# Patient Record
Sex: Male | Born: 1940 | Race: White | Hispanic: No | Marital: Married | Smoking: Never smoker
Health system: Southern US, Community
[De-identification: ages and names within clinical notes are randomized; demographics above are authoritative.]

## PROBLEM LIST (undated history)

## (undated) DIAGNOSIS — R7989 Other specified abnormal findings of blood chemistry: Secondary | ICD-10-CM

## (undated) DIAGNOSIS — Z87448 Personal history of other diseases of urinary system: Secondary | ICD-10-CM

## (undated) DIAGNOSIS — Z85828 Personal history of other malignant neoplasm of skin: Secondary | ICD-10-CM

## (undated) DIAGNOSIS — N400 Enlarged prostate without lower urinary tract symptoms: Secondary | ICD-10-CM

## (undated) DIAGNOSIS — E785 Hyperlipidemia, unspecified: Secondary | ICD-10-CM

## (undated) DIAGNOSIS — I10 Essential (primary) hypertension: Secondary | ICD-10-CM

## (undated) DIAGNOSIS — Z87442 Personal history of urinary calculi: Secondary | ICD-10-CM

## (undated) DIAGNOSIS — C801 Malignant (primary) neoplasm, unspecified: Secondary | ICD-10-CM

## (undated) DIAGNOSIS — E119 Type 2 diabetes mellitus without complications: Secondary | ICD-10-CM

## (undated) DIAGNOSIS — Z87440 Personal history of urinary (tract) infections: Secondary | ICD-10-CM

## (undated) HISTORY — PX: TONSILLECTOMY: SUR1361

## (undated) HISTORY — PX: HAND SURGERY: SHX662

---

## 2000-07-04 ENCOUNTER — Ambulatory Visit (HOSPITAL_BASED_OUTPATIENT_CLINIC_OR_DEPARTMENT_OTHER): Admission: RE | Admit: 2000-07-04 | Discharge: 2000-07-04 | Payer: Self-pay | Admitting: Orthopedic Surgery

## 2009-04-02 ENCOUNTER — Encounter: Admission: RE | Admit: 2009-04-02 | Discharge: 2009-04-02 | Payer: Self-pay | Admitting: Internal Medicine

## 2010-08-12 IMAGING — CR DG THORACIC SPINE 3V
4 series · 4 of 4 positions shown · non-contrast
Comparison: None

CLINICAL DATA: Possible T9 fracture

THORACIC SPINE - 2 VIEW + SWIMMERS

[t t-spine a.p.]
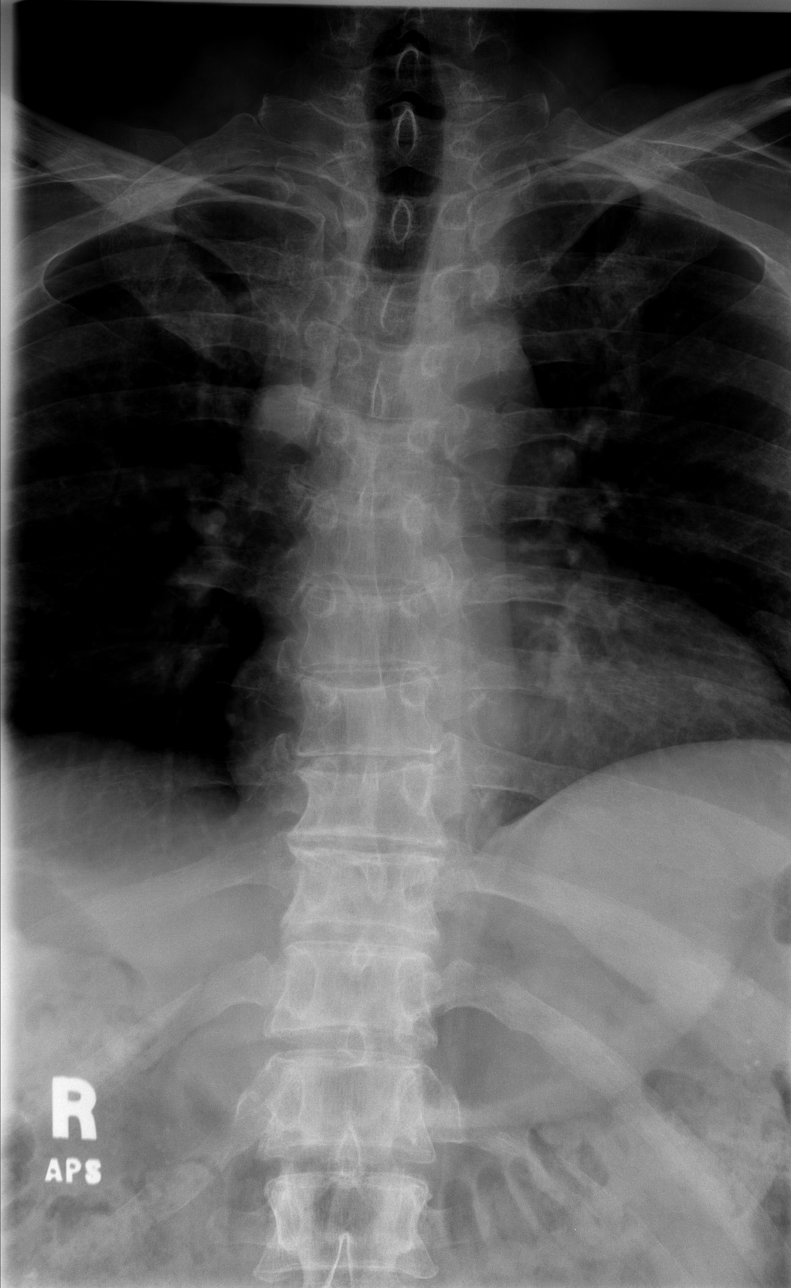

[t t-spine lat * (1 of 2)]
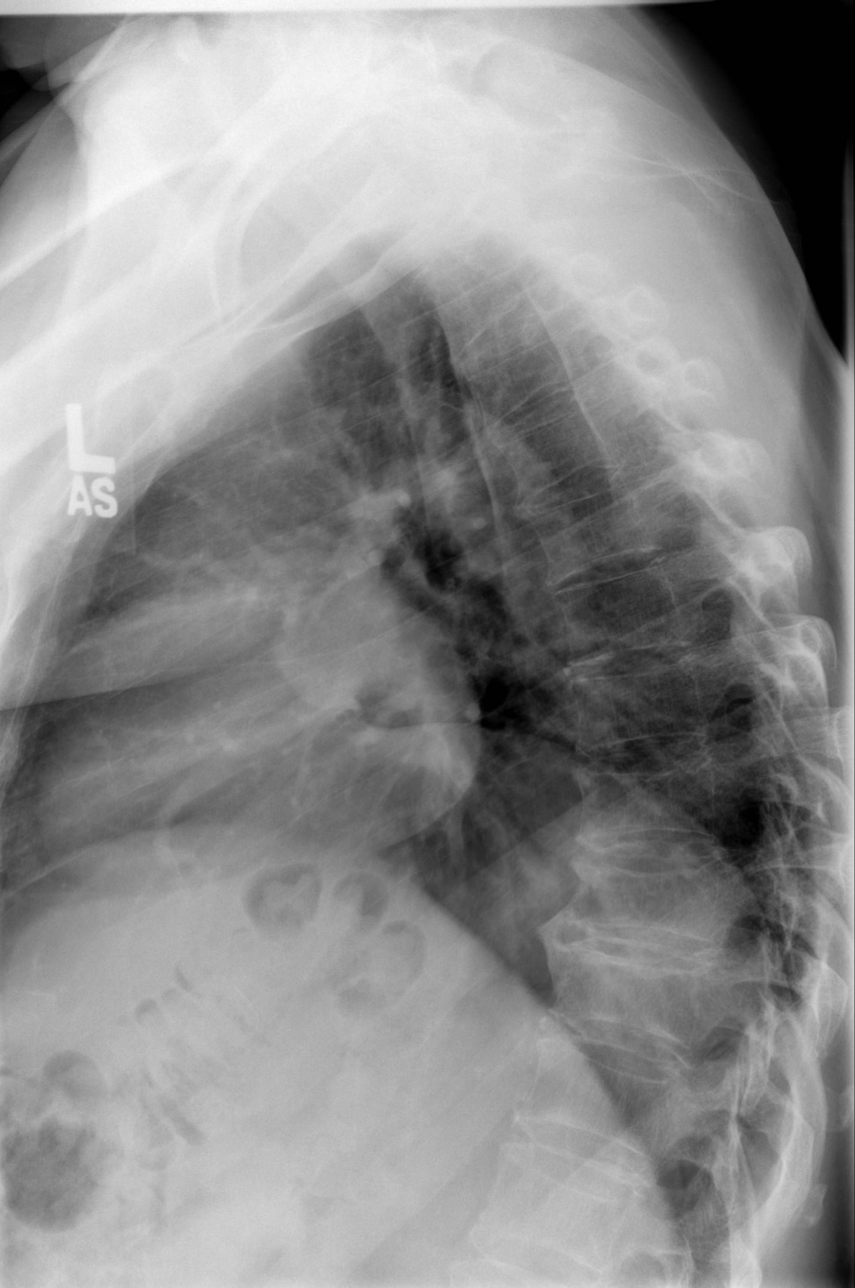

[t t-spine lat * (2 of 2)]
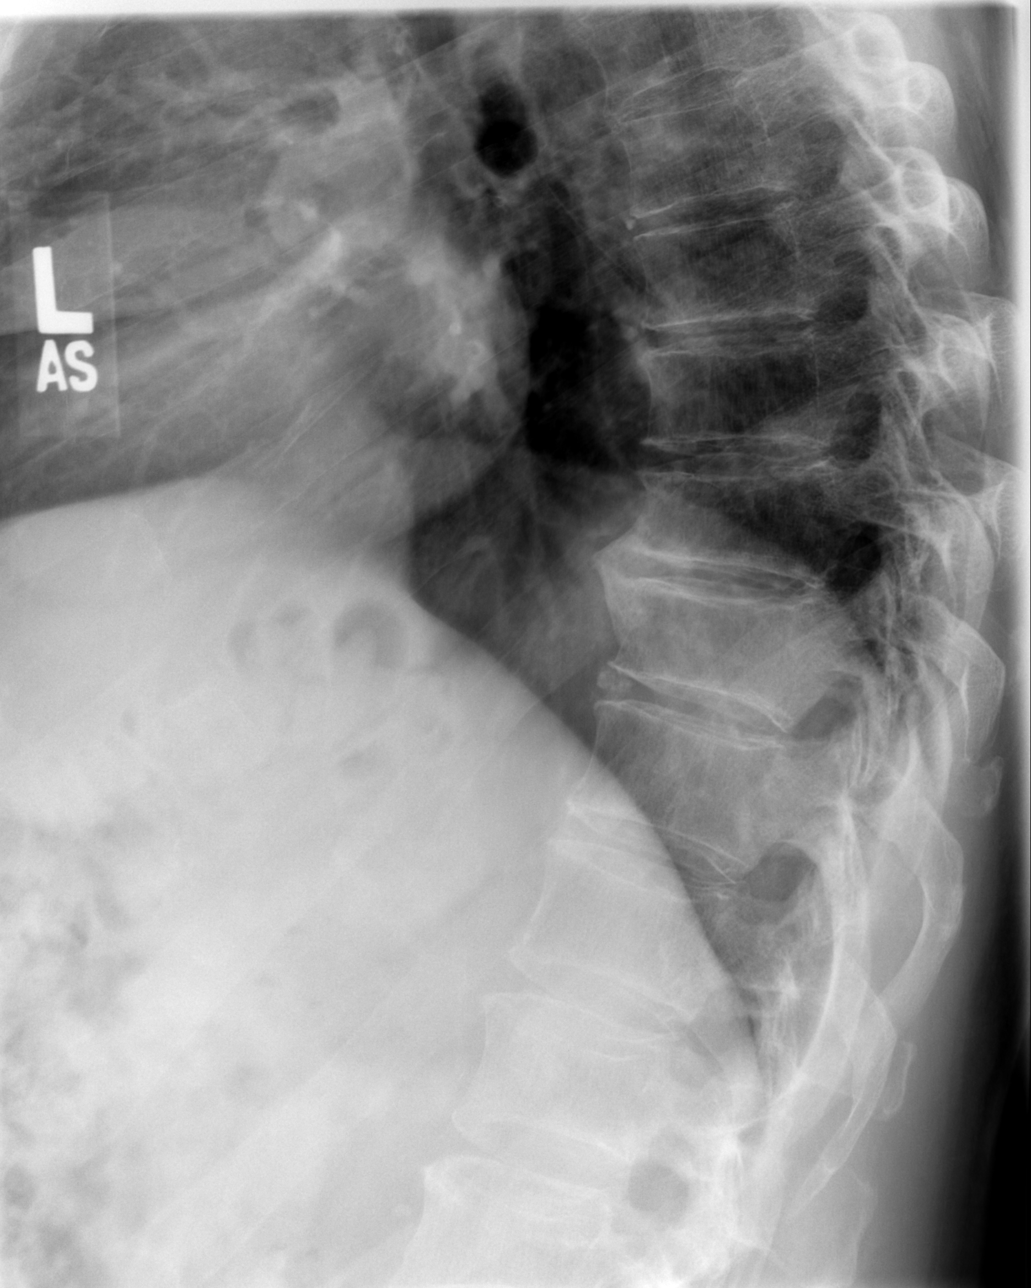

[t swimmers]
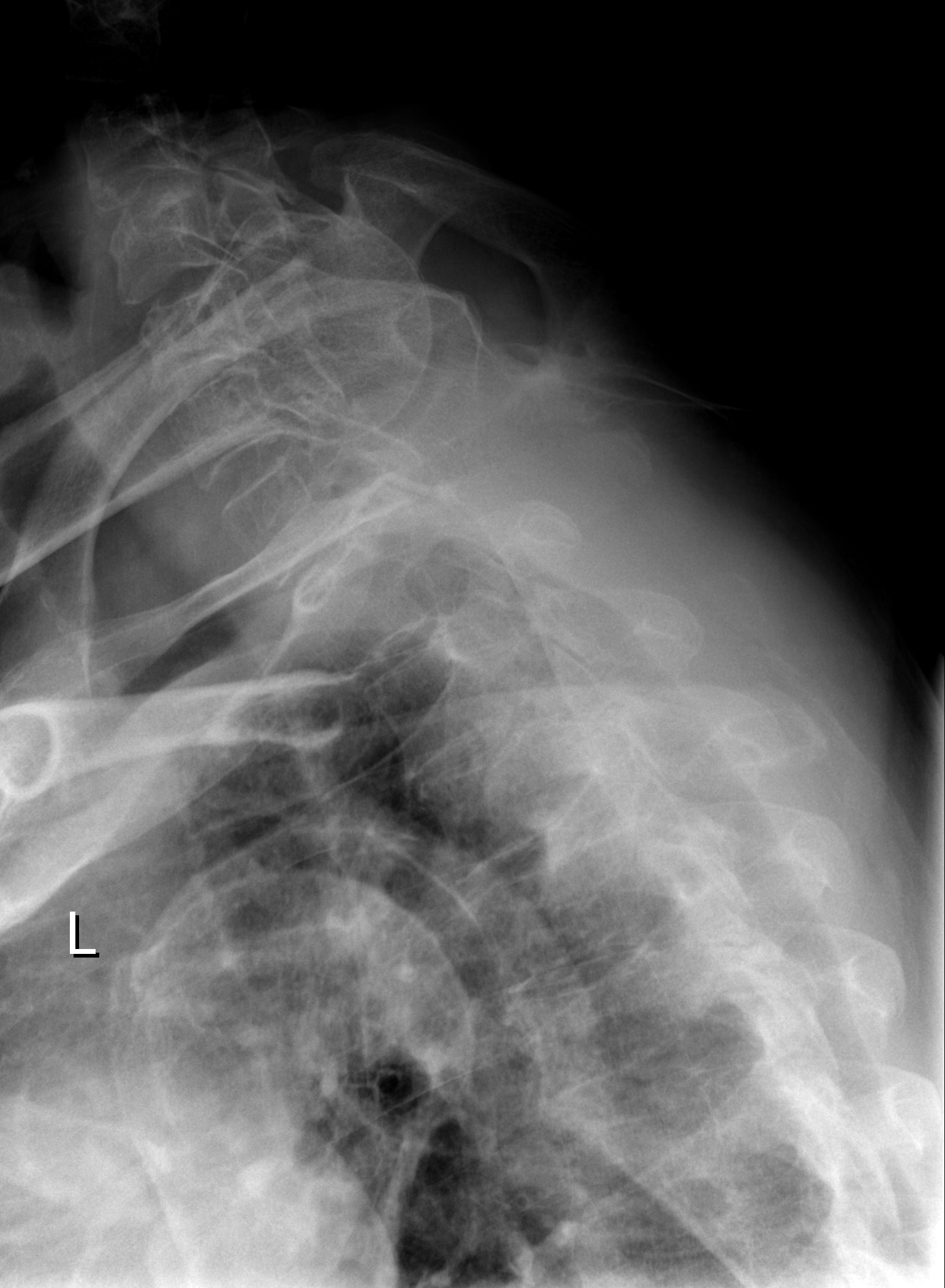

[4 of 4 positions shown; findings below may reference images not displayed]

FINDINGS: Fracture of the right aspect of T9 body with mild
depression.  No retropulsion.  Spondylotic changes are noted in the
lower thoracic spine.  No evidence of bony destruction/lysis.
IMPRESSION: T9 compression fracture.

## 2010-10-02 HISTORY — PX: CYSTOSCOPY: SUR368

## 2010-10-02 HISTORY — PX: OTHER SURGICAL HISTORY: SHX169

## 2010-11-10 ENCOUNTER — Encounter (HOSPITAL_COMMUNITY)
Admission: RE | Admit: 2010-11-10 | Discharge: 2010-11-10 | Disposition: A | Discharge status: Home / Self Care | Payer: Medicare Other | Source: Ambulatory Visit | Attending: General Surgery | Admitting: General Surgery

## 2010-11-10 ENCOUNTER — Ambulatory Visit (HOSPITAL_COMMUNITY)
Admission: RE | Admit: 2010-11-10 | Discharge: 2010-11-10 | Disposition: A | Discharge status: Home / Self Care | Payer: Medicare Other | Source: Ambulatory Visit | Attending: General Surgery | Admitting: General Surgery

## 2010-11-10 ENCOUNTER — Other Ambulatory Visit (HOSPITAL_COMMUNITY): Payer: Self-pay | Admitting: General Surgery

## 2010-11-10 DIAGNOSIS — M439 Deforming dorsopathy, unspecified: Secondary | ICD-10-CM | POA: Insufficient documentation

## 2010-11-10 DIAGNOSIS — R222 Localized swelling, mass and lump, trunk: Secondary | ICD-10-CM | POA: Insufficient documentation

## 2010-11-10 DIAGNOSIS — Z01818 Encounter for other preprocedural examination: Secondary | ICD-10-CM | POA: Insufficient documentation

## 2010-11-14 ENCOUNTER — Ambulatory Visit (HOSPITAL_COMMUNITY)
Admission: RE | Admit: 2010-11-14 | Discharge: 2010-11-14 | Disposition: A | Discharge status: Home / Self Care | Payer: Medicare Other | Source: Ambulatory Visit | Attending: General Surgery | Admitting: General Surgery

## 2010-11-14 ENCOUNTER — Other Ambulatory Visit: Payer: Self-pay | Admitting: General Surgery

## 2010-11-14 DIAGNOSIS — D235 Other benign neoplasm of skin of trunk: Secondary | ICD-10-CM | POA: Insufficient documentation

## 2010-11-14 DIAGNOSIS — Z01812 Encounter for preprocedural laboratory examination: Secondary | ICD-10-CM | POA: Insufficient documentation

## 2010-11-14 DIAGNOSIS — Z01810 Encounter for preprocedural cardiovascular examination: Secondary | ICD-10-CM | POA: Insufficient documentation

## 2010-11-14 LAB — BASIC METABOLIC PANEL
CO2: 23 mEq/L (ref 19–32)
Calcium: 9.8 mg/dL (ref 8.4–10.5)
Creatinine, Ser: 1.66 mg/dL — ABNORMAL HIGH (ref 0.4–1.5)
GFR calc Af Amer: 50 mL/min — ABNORMAL LOW (ref >60)
Sodium: 139 mEq/L (ref 135–145)

## 2010-11-16 LAB — GLUCOSE, CAPILLARY: Glucose-Capillary: 148 mg/dL — ABNORMAL HIGH (ref 70–99)

## 2010-11-20 NOTE — Op Note (Signed)
  NAME:  Kevin Clements, Kevin Clements            ACCOUNT NO.:  [id number]  MEDICAL RECORD NO.:  [id number]           PATIENT TYPE:  O  LOCATION:  SDSC                         FACILITY:  [facility]  PHYSICIAN:  Almond Lint, MD       DATE OF BIRTH:  Mar 17, 1941  DATE OF PROCEDURE:  11/14/2010 DATE OF DISCHARGE:  11/14/2010                              OPERATIVE REPORT   PREOPERATIVE DIAGNOSIS:  Left back mass.  POSTOPERATIVE DIAGNOSIS:  Left back mass.  PROCEDURE:  Excision of left back mass.  SURGEON:  Almond Lint, MD  ANESTHESIA:  General and local.  FINDINGS:  Probable sebaceous cyst.  SPECIMEN:  Left back mass to Pathology.  ESTIMATED BLOOD LOSS:  Minimal.  COMPLICATIONS:  None known.  PROCEDURE:  [name] was identified in the holding area and taken to the operating room where he was placed supine on the operating table. General anesthesia was induced under LMA.  He was then placed into the right lateral decubitus position.  The prior incision was identified and anesthetized.  The mass was mobile.  An ellipse of skin was taken around the old incision and the skin was elevated with two Allis clamps.  A #15 blade was used to dissect around the mass.  The mass was noted to be tethered to the skin approximately a centimeter and a half cranial from the prior incision.  This was shaved off with the 15 blade.  The mass was taken all the way around to the fascia on both sides.  The cavity was copiously irrigated.  The wound was inspected for hemostasis with cautery, and all the subcutaneous flaps and the directions were examined carefully.  The wound was then reapproximated with 3-0 Vicryl deep dermal suture and 4-0 Monocryl running subcuticular suture.  The wound was then dressed with benzoin, Steri-Strips, gauze, and Tegaderm.  The patient was placed back in the supine position and was extubated and taken to the PACU in stable condition.  Needle, sponge, and instrument counts were  correct.    Almond Lint, MD    FB/MEDQ  D:  11/14/2010  T:  11/15/2010  Job:  [id number]  Electronically Signed by Almond Lint MD on 11/20/2010 12:30:55 AM

## 2010-11-24 LAB — CBC
HCT: 41.3 % (ref 39.0–52.0)
MCHC: 34.1 g/dL (ref 30.0–36.0)
Platelets: 129 10*3/uL — ABNORMAL LOW (ref 150–400)
RDW: 13.2 % (ref 11.5–15.5)
WBC: 4.8 10*3/uL (ref 4.0–10.5)

## 2010-11-24 LAB — BASIC METABOLIC PANEL
BUN: 41 mg/dL — ABNORMAL HIGH (ref 6–23)
Calcium: 9.8 mg/dL (ref 8.4–10.5)
Creatinine, Ser: 1.8 mg/dL — ABNORMAL HIGH (ref 0.4–1.5)
GFR calc non Af Amer: 38 mL/min — ABNORMAL LOW (ref >60)
Glucose, Bld: 153 mg/dL — ABNORMAL HIGH (ref 70–99)
Potassium: 4.3 mEq/L (ref 3.5–5.1)

## 2010-11-24 LAB — SURGICAL PCR SCREEN
MRSA, PCR: NEGATIVE
Staphylococcus aureus: POSITIVE — AB

## 2011-02-17 NOTE — Op Note (Signed)
[facility]  Patient:    Kevin Clements, Kevin Clements                     MRN: [id number] Proc. Date: 07/04/00 Adm. 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  PROCEDURE:  Excision of Dupuytrens contracture, palmar aspect right hand, with release of metatarsophalangeal and proximal interphalangeal joints, right finger on the right.  SURGEON:  Artist Pais. Mina Marble, [name]  ASSISTANT:  Junius Roads. Ireton, P.A.C.  ANESTHESIA:  Axillary block.  TOURNIQUET TIME:  1 hour 28 minutes.  COMPLICATIONS:  None.  DRAINS:  None.  DESCRIPTION OF PROCEDURE:  The patient was taken to the operating room and after the induction of adequate axillary block analgesia, the right upper extremity was prepped and draped in the usual sterile fashion.  Esmarch was used to exsanguinate the limb, and the tourniquet was inflated to 250 mmHg. At this point in time, Brunner-type incisions were made in line with the metacarpal on the ring finger extending out to the level of the PIP joint as well as over the metacarpal of the long finger, and flaps were raised accordingly.  After flaps were raised the ring finger Dupuytrens cord was identified proximally at the intersection of the palmar fascia with the diseased cord, and it was transected.  It was then carefully dissected free to the level of the A1 pulley.  At this point in time, the neurovascular bundles were both identified radially and ulnarly, and dissection was carried out. The ulnar neurovascular bundle was seen to be crossing the midline at the area of the  metacarpophalangeal joint, then diving deep and re-emerging along the ulnar border.  The Dupuytrens cord was intimately involved with the ulnar neurovascular bundle, which was carefully dissected free with tedious careful dissection using the tenotomy scissors and a 15 blade.  Eventually the entire Dupuytrens cord was excised in its entirety and sent for pathologic confirmation.  At this point in time, the incision over the long finger was dissected down through the intersection between diseased and normal fascia. This was carefully excised with identification of the neurovascular bundles radially and ulnarly.  At this point in time, the would was thoroughly irrigated, hemostasis was achieved with bipolar cautery, and the wounds were then closed with 5-0 nylon in a combination of simple and horizontal mattress sutures.  Prior to wound closure, a #5 pediatric feeding tube was placed into the wounds and a mixture of triamcinolone acetate and 0.25% plain Marcaine was injected into the wound for postoperative pain control.  The wound was then dressed with Xeroform, 4 x 4s, fluffs, and a splint with the fingers in maximum extension.  The patient tolerated the procedure well and went to recovery in stable fashion. DD:  07/04/00 TD:  07/04/00 Job: [id number]

## 2011-03-22 ENCOUNTER — Inpatient Hospital Stay (HOSPITAL_COMMUNITY): Payer: Medicare Other

## 2011-03-22 ENCOUNTER — Inpatient Hospital Stay (HOSPITAL_COMMUNITY)
Admission: AD | Admit: 2011-03-22 | Discharge: 2011-03-30 | DRG: 670 | Disposition: A | Discharge status: Home / Self Care | Payer: Medicare Other | Source: Ambulatory Visit | Attending: Internal Medicine | Admitting: Internal Medicine

## 2011-03-22 DIAGNOSIS — E1149 Type 2 diabetes mellitus with other diabetic neurological complication: Secondary | ICD-10-CM | POA: Diagnosis present

## 2011-03-22 DIAGNOSIS — N183 Chronic kidney disease, stage 3 unspecified: Secondary | ICD-10-CM | POA: Diagnosis present

## 2011-03-22 DIAGNOSIS — R197 Diarrhea, unspecified: Secondary | ICD-10-CM | POA: Diagnosis present

## 2011-03-22 DIAGNOSIS — K219 Gastro-esophageal reflux disease without esophagitis: Secondary | ICD-10-CM | POA: Diagnosis present

## 2011-03-22 DIAGNOSIS — R339 Retention of urine, unspecified: Secondary | ICD-10-CM | POA: Diagnosis present

## 2011-03-22 DIAGNOSIS — K4090 Unilateral inguinal hernia, without obstruction or gangrene, not specified as recurrent: Secondary | ICD-10-CM | POA: Diagnosis present

## 2011-03-22 DIAGNOSIS — N1330 Unspecified hydronephrosis: Secondary | ICD-10-CM | POA: Diagnosis present

## 2011-03-22 DIAGNOSIS — M899 Disorder of bone, unspecified: Secondary | ICD-10-CM | POA: Diagnosis present

## 2011-03-22 DIAGNOSIS — E785 Hyperlipidemia, unspecified: Secondary | ICD-10-CM | POA: Diagnosis present

## 2011-03-22 DIAGNOSIS — N320 Bladder-neck obstruction: Secondary | ICD-10-CM | POA: Diagnosis present

## 2011-03-22 DIAGNOSIS — E860 Dehydration: Secondary | ICD-10-CM | POA: Diagnosis present

## 2011-03-22 DIAGNOSIS — E1142 Type 2 diabetes mellitus with diabetic polyneuropathy: Secondary | ICD-10-CM | POA: Diagnosis present

## 2011-03-22 DIAGNOSIS — N400 Enlarged prostate without lower urinary tract symptoms: Secondary | ICD-10-CM | POA: Diagnosis present

## 2011-03-22 DIAGNOSIS — H43399 Other vitreous opacities, unspecified eye: Secondary | ICD-10-CM | POA: Diagnosis present

## 2011-03-22 DIAGNOSIS — A088 Other specified intestinal infections: Secondary | ICD-10-CM | POA: Diagnosis present

## 2011-03-22 DIAGNOSIS — Z87891 Personal history of nicotine dependence: Secondary | ICD-10-CM

## 2011-03-22 DIAGNOSIS — I129 Hypertensive chronic kidney disease with stage 1 through stage 4 chronic kidney disease, or unspecified chronic kidney disease: Secondary | ICD-10-CM | POA: Diagnosis present

## 2011-03-22 DIAGNOSIS — E875 Hyperkalemia: Secondary | ICD-10-CM | POA: Diagnosis present

## 2011-03-22 DIAGNOSIS — Z794 Long term (current) use of insulin: Secondary | ICD-10-CM

## 2011-03-22 DIAGNOSIS — E7800 Pure hypercholesterolemia, unspecified: Secondary | ICD-10-CM | POA: Diagnosis present

## 2011-03-22 DIAGNOSIS — M949 Disorder of cartilage, unspecified: Secondary | ICD-10-CM | POA: Diagnosis present

## 2011-03-22 DIAGNOSIS — N170 Acute kidney failure with tubular necrosis: Principal | ICD-10-CM | POA: Diagnosis present

## 2011-03-22 DIAGNOSIS — N210 Calculus in bladder: Secondary | ICD-10-CM | POA: Diagnosis present

## 2011-03-22 DIAGNOSIS — Z7982 Long term (current) use of aspirin: Secondary | ICD-10-CM

## 2011-03-22 LAB — COMPREHENSIVE METABOLIC PANEL
ALT: 16 U/L (ref 0–53)
AST: 12 U/L (ref 0–37)
Alkaline Phosphatase: 40 U/L (ref 39–117)
CO2: 16 mEq/L — ABNORMAL LOW (ref 19–32)
Calcium: 8.6 mg/dL (ref 8.4–10.5)
Chloride: 105 mEq/L (ref 96–112)
GFR calc Af Amer: 11 mL/min — ABNORMAL LOW (ref >60)
GFR calc non Af Amer: 9 mL/min — ABNORMAL LOW (ref >60)
Glucose, Bld: 101 mg/dL — ABNORMAL HIGH (ref 70–99)
Potassium: 6.5 mEq/L (ref 3.5–5.1)
Sodium: 133 mEq/L — ABNORMAL LOW (ref 135–145)

## 2011-03-22 LAB — BASIC METABOLIC PANEL
Chloride: 102 mEq/L (ref 96–112)
GFR calc Af Amer: 11 mL/min — ABNORMAL LOW (ref >60)
GFR calc non Af Amer: 9 mL/min — ABNORMAL LOW (ref >60)
Potassium: 6 mEq/L — ABNORMAL HIGH (ref 3.5–5.1)
Sodium: 130 mEq/L — ABNORMAL LOW (ref 135–145)

## 2011-03-22 LAB — CBC
Hemoglobin: 12.2 g/dL — ABNORMAL LOW (ref 13.0–17.0)
MCH: 32.5 pg (ref 26.0–34.0)
Platelets: 180 10*3/uL (ref 150–400)
RBC: 3.75 MIL/uL — ABNORMAL LOW (ref 4.22–5.81)
WBC: 6.6 10*3/uL (ref 4.0–10.5)

## 2011-03-22 LAB — URINALYSIS, ROUTINE W REFLEX MICROSCOPIC
Glucose, UA: 100 mg/dL — AB
Hgb urine dipstick: NEGATIVE
Ketones, ur: NEGATIVE mg/dL
Leukocytes, UA: NEGATIVE
pH: 5.5 (ref 5.0–8.0)

## 2011-03-22 LAB — CARDIAC PANEL(CRET KIN+CKTOT+MB+TROPI)
CK, MB: 3.6 ng/mL (ref 0.3–4.0)
Relative Index: 3.5 — ABNORMAL HIGH (ref 0.0–2.5)
Troponin I: <0.3 ng/mL (ref <0.30)

## 2011-03-22 LAB — CREATININE, URINE, RANDOM: Creatinine, Urine: 47.98 mg/dL

## 2011-03-23 LAB — CBC
HCT: 32.6 % — ABNORMAL LOW (ref 39.0–52.0)
MCH: 31.1 pg (ref 26.0–34.0)
MCHC: 34.4 g/dL (ref 30.0–36.0)
MCV: 90.6 fL (ref 78.0–100.0)
RDW: 12.8 % (ref 11.5–15.5)

## 2011-03-23 LAB — BASIC METABOLIC PANEL
CO2: 16 mEq/L — ABNORMAL LOW (ref 19–32)
Calcium: 7.7 mg/dL — ABNORMAL LOW (ref 8.4–10.5)
Calcium: 7.2 mg/dL — ABNORMAL LOW (ref 8.4–10.5)
Creatinine, Ser: 5.82 mg/dL — ABNORMAL HIGH (ref 0.50–1.35)
GFR calc Af Amer: 12 mL/min — ABNORMAL LOW (ref >60)
GFR calc non Af Amer: 10 mL/min — ABNORMAL LOW (ref >60)
GFR calc non Af Amer: 10 mL/min — ABNORMAL LOW (ref >60)
Glucose, Bld: 140 mg/dL — ABNORMAL HIGH (ref 70–99)
Sodium: 134 mEq/L — ABNORMAL LOW (ref 135–145)

## 2011-03-23 LAB — PTH, INTACT AND CALCIUM
Calcium, Total (PTH): 8.6 mg/dL (ref 8.4–10.5)
PTH: 168 pg/mL — ABNORMAL HIGH (ref 14.0–72.0)

## 2011-03-23 LAB — GLUCOSE, CAPILLARY
Glucose-Capillary: 84 mg/dL (ref 70–99)
Glucose-Capillary: 119 mg/dL — ABNORMAL HIGH (ref 70–99)

## 2011-03-23 LAB — COMPREHENSIVE METABOLIC PANEL
Albumin: 2.9 g/dL — ABNORMAL LOW (ref 3.5–5.2)
BUN: 92 mg/dL — ABNORMAL HIGH (ref 6–23)
Calcium: 8 mg/dL — ABNORMAL LOW (ref 8.4–10.5)
Creatinine, Ser: 5.75 mg/dL — ABNORMAL HIGH (ref 0.50–1.35)
Total Bilirubin: 0.2 mg/dL — ABNORMAL LOW (ref 0.3–1.2)
Total Protein: 6.3 g/dL (ref 6.0–8.3)

## 2011-03-23 LAB — CLOSTRIDIUM DIFFICILE BY PCR: Toxigenic C. Difficile by PCR: NEGATIVE

## 2011-03-24 LAB — BASIC METABOLIC PANEL
Calcium: 7.7 mg/dL — ABNORMAL LOW (ref 8.4–10.5)
Creatinine, Ser: 5.3 mg/dL — ABNORMAL HIGH (ref 0.50–1.35)
GFR calc Af Amer: 13 mL/min — ABNORMAL LOW (ref >60)
GFR calc non Af Amer: 11 mL/min — ABNORMAL LOW (ref >60)

## 2011-03-24 LAB — GLUCOSE, CAPILLARY
Glucose-Capillary: 98 mg/dL (ref 70–99)
Glucose-Capillary: 154 mg/dL — ABNORMAL HIGH (ref 70–99)
Glucose-Capillary: 116 mg/dL — ABNORMAL HIGH (ref 70–99)

## 2011-03-24 LAB — CBC
HCT: 33.5 % — ABNORMAL LOW (ref 39.0–52.0)
MCHC: 33.7 g/dL (ref 30.0–36.0)
MCV: 92.3 fL (ref 78.0–100.0)
Platelets: 178 10*3/uL (ref 150–400)
RDW: 13 % (ref 11.5–15.5)

## 2011-03-24 LAB — COMPREHENSIVE METABOLIC PANEL
Albumin: 3 g/dL — ABNORMAL LOW (ref 3.5–5.2)
BUN: 80 mg/dL — ABNORMAL HIGH (ref 6–23)
Creatinine, Ser: 5.35 mg/dL — ABNORMAL HIGH (ref 0.50–1.35)
Potassium: 6.7 mEq/L (ref 3.5–5.1)
Total Protein: 6.4 g/dL (ref 6.0–8.3)

## 2011-03-25 LAB — BASIC METABOLIC PANEL
BUN: 76 mg/dL — ABNORMAL HIGH (ref 6–23)
Creatinine, Ser: 5.35 mg/dL — ABNORMAL HIGH (ref 0.50–1.35)
GFR calc Af Amer: 13 mL/min — ABNORMAL LOW (ref >60)
GFR calc non Af Amer: 11 mL/min — ABNORMAL LOW (ref >60)

## 2011-03-25 LAB — GLUCOSE, CAPILLARY: Glucose-Capillary: 140 mg/dL — ABNORMAL HIGH (ref 70–99)

## 2011-03-26 LAB — COMPREHENSIVE METABOLIC PANEL
AST: 21 U/L (ref 0–37)
Albumin: 2.9 g/dL — ABNORMAL LOW (ref 3.5–5.2)
Chloride: 97 mEq/L (ref 96–112)
Creatinine, Ser: 5.29 mg/dL — ABNORMAL HIGH (ref 0.50–1.35)
Potassium: 4.5 mEq/L (ref 3.5–5.1)
Total Bilirubin: 0.3 mg/dL (ref 0.3–1.2)

## 2011-03-26 LAB — GLUCOSE, CAPILLARY
Glucose-Capillary: 209 mg/dL — ABNORMAL HIGH (ref 70–99)
Glucose-Capillary: 156 mg/dL — ABNORMAL HIGH (ref 70–99)
Glucose-Capillary: 148 mg/dL — ABNORMAL HIGH (ref 70–99)
Glucose-Capillary: 159 mg/dL — ABNORMAL HIGH (ref 70–99)

## 2011-03-26 LAB — STOOL CULTURE

## 2011-03-27 ENCOUNTER — Inpatient Hospital Stay (HOSPITAL_COMMUNITY): Payer: Medicare Other

## 2011-03-27 ENCOUNTER — Other Ambulatory Visit (HOSPITAL_COMMUNITY): Payer: Medicare Other

## 2011-03-27 LAB — GLUCOSE, CAPILLARY
Glucose-Capillary: 110 mg/dL — ABNORMAL HIGH (ref 70–99)
Glucose-Capillary: 198 mg/dL — ABNORMAL HIGH (ref 70–99)

## 2011-03-27 LAB — COMPREHENSIVE METABOLIC PANEL
Albumin: 3.2 g/dL — ABNORMAL LOW (ref 3.5–5.2)
BUN: 62 mg/dL — ABNORMAL HIGH (ref 6–23)
Calcium: 7.6 mg/dL — ABNORMAL LOW (ref 8.4–10.5)
Creatinine, Ser: 5.06 mg/dL — ABNORMAL HIGH (ref 0.50–1.35)
Total Protein: 6.5 g/dL (ref 6.0–8.3)

## 2011-03-28 ENCOUNTER — Ambulatory Visit (HOSPITAL_COMMUNITY)
Admission: RE | Admit: 2011-03-28 | Discharge: 2011-03-28 | Disposition: A | Discharge status: Home / Self Care | Payer: Medicare Other | Source: Ambulatory Visit | Attending: Urology | Admitting: Urology

## 2011-03-28 DIAGNOSIS — Z01812 Encounter for preprocedural laboratory examination: Secondary | ICD-10-CM | POA: Insufficient documentation

## 2011-03-28 DIAGNOSIS — N1330 Unspecified hydronephrosis: Secondary | ICD-10-CM | POA: Insufficient documentation

## 2011-03-28 DIAGNOSIS — N400 Enlarged prostate without lower urinary tract symptoms: Secondary | ICD-10-CM | POA: Insufficient documentation

## 2011-03-28 DIAGNOSIS — N210 Calculus in bladder: Secondary | ICD-10-CM | POA: Insufficient documentation

## 2011-03-28 DIAGNOSIS — N201 Calculus of ureter: Secondary | ICD-10-CM | POA: Insufficient documentation

## 2011-03-28 LAB — URINALYSIS, ROUTINE W REFLEX MICROSCOPIC
Bilirubin Urine: NEGATIVE
Ketones, ur: 15 mg/dL — AB
Protein, ur: 100 mg/dL — AB
Urobilinogen, UA: 0.2 mg/dL (ref 0.0–1.0)

## 2011-03-28 LAB — SURGICAL PCR SCREEN
MRSA, PCR: NEGATIVE
Staphylococcus aureus: POSITIVE — AB

## 2011-03-28 LAB — RENAL FUNCTION PANEL
Albumin: 3 g/dL — ABNORMAL LOW (ref 3.5–5.2)
BUN: 60 mg/dL — ABNORMAL HIGH (ref 6–23)
Chloride: 107 mEq/L (ref 96–112)
Creatinine, Ser: 5.12 mg/dL — ABNORMAL HIGH (ref 0.50–1.35)
Glucose, Bld: 127 mg/dL — ABNORMAL HIGH (ref 70–99)

## 2011-03-28 LAB — UIFE/LIGHT CHAINS/TP QN, 24-HR UR
Free Kappa Lt Chains,Ur: 1.74 mg/dL (ref 0.14–2.42)
Free Kappa/Lambda Ratio: 7.91 ratio (ref 2.04–10.37)
Total Protein, Urine: 2.6 mg/dL

## 2011-03-28 LAB — CBC
HCT: 32.4 % — ABNORMAL LOW (ref 39.0–52.0)
MCHC: 33.6 g/dL (ref 30.0–36.0)
MCV: 91.3 fL (ref 78.0–100.0)
RDW: 12.3 % (ref 11.5–15.5)
WBC: 5.8 10*3/uL (ref 4.0–10.5)

## 2011-03-28 LAB — GLUCOSE, CAPILLARY
Glucose-Capillary: 86 mg/dL (ref 70–99)
Glucose-Capillary: 98 mg/dL (ref 70–99)

## 2011-03-29 LAB — GLUCOSE, CAPILLARY
Glucose-Capillary: 105 mg/dL — ABNORMAL HIGH (ref 70–99)
Glucose-Capillary: 87 mg/dL (ref 70–99)
Glucose-Capillary: 183 mg/dL — ABNORMAL HIGH (ref 70–99)
Glucose-Capillary: 202 mg/dL — ABNORMAL HIGH (ref 70–99)
Glucose-Capillary: 145 mg/dL — ABNORMAL HIGH (ref 70–99)

## 2011-03-29 LAB — RENAL FUNCTION PANEL
CO2: 20 mEq/L (ref 19–32)
Calcium: 8.4 mg/dL (ref 8.4–10.5)
Chloride: 112 mEq/L (ref 96–112)
Creatinine, Ser: 2.65 mg/dL — ABNORMAL HIGH (ref 0.50–1.35)
Glucose, Bld: 157 mg/dL — ABNORMAL HIGH (ref 70–99)

## 2011-03-29 LAB — PROTEIN ELECTROPH W RFLX QUANT IMMUNOGLOBULINS
Albumin ELP: 53.4 % — ABNORMAL LOW (ref 55.8–66.1)
Beta 2: 4.8 % (ref 3.2–6.5)
Total Protein ELP: 6.2 g/dL (ref 6.0–8.3)

## 2011-03-29 LAB — CBC
HCT: 35.1 % — ABNORMAL LOW (ref 39.0–52.0)
Hemoglobin: 11.6 g/dL — ABNORMAL LOW (ref 13.0–17.0)
MCH: 30.9 pg (ref 26.0–34.0)
MCV: 93.4 fL (ref 78.0–100.0)
RBC: 3.76 MIL/uL — ABNORMAL LOW (ref 4.22–5.81)
WBC: 6.6 10*3/uL (ref 4.0–10.5)

## 2011-03-29 NOTE — Op Note (Signed)
NAME:  Kevin Clements, Kevin Clements NO.:  [id number]  MEDICAL RECORD NO.:  [id number]  LOCATION:  DAY                          FACILITY:  [facility]  PHYSICIAN:  Bertram Millard. Keiland Pickering, M.D.DATE OF BIRTH:  23-Jun-1941  DATE OF PROCEDURE:  03/28/2011 DATE OF DISCHARGE:  03/28/2011                              OPERATIVE REPORT   PREOPERATIVE DIAGNOSES:  A 7 mm right ureteral stone with hydronephrosis, multiple bladder calculi, benign prostatic hyperplasia.  POSTOPERATIVE DIAGNOSES:  A 7 mm right ureteral stone with hydronephrosis, multiple bladder calculi, benign prostatic hyperplasia.  PRINCIPAL PROCEDURES:  Cystoscopy, right ureteroscopy with holmium laser, and extraction of right ureteral stone, right double-J stent placement (6 x 26 cm contour stent without string), cystolithalopaxy with laser or bladder calculi, placement of Foley catheter.  SURGEON:  [name], MD  ANESTHESIA:  General.  COMPLICATIONS:  None.  SPECIMEN:  Stones, to the patient's family.  BRIEF HISTORY:  This 70 year old male presented last week with acute renal insufficiency, on top of chronic renal insufficiency.  He was found to have worsening right hydronephrosis on ultrasound.  CT urogram was performed last night revealing multiple bladder stones with an obstructing 7 mm right distal ureteral stone with significant right hydronephrosis.  The patient's creatinine is 5.  It was recommended that he undergo urgent treatment of this ureteral stone with possible ureteroscopy, holmium laser, and extraction.  The patient does have multiple bladder calculi, the largest of these is probably about 12 mm in size.  He has had recurrent pyuria.  He does have BPH and TURP has been a consideration in the past.  At this point, I have recommended that we proceed with removal of all of the stones that are in his ureter and bladder.  Risks and complications of the procedure have been discussed with the  patient.  He understands these and desires to proceed.  We will treat his BPH and outlet obstruction down the road with 5-alpha reductase inhibitors.  DESCRIPTION OF PROCEDURE:  The patient's right side was marked and he received preoperative Cipro this morning intravenously.  He was taken to the operating room after appropriate identification and he was anesthetized with general anesthesia and LMA was placed.  He was placed in the dorsal lithotomy position.  Genitalia and perineum were prepped and draped.  Time-out was then performed.  The procedure then commenced.  A 22-French panendoscope was advanced through his urethra.  His prostate was quite large, with the left lobe of the prostate being larger than the right.  After some time, I was eventually able to identify the right ureteral orifice.  Using a Glidewire, I gained access to the more proximal ureter past the stone with a retrograde revealing hydroureter proximal to the stone.  I then placed a Sensor-tip guidewire after the Glidewire was removed. Ureteroscopy was performed.  The ureteral stone was fragmented into multiple smaller fragments with the 365 micron laser fiber using holmium laser.  The stones were extracted into the bladder with a nitinol basket.  I then passed a 26 cm x 6-French contour stent over the guidewire.  Good proximal and distal curls were seen after the guidewire was removed.  I then focused  my attention on the bladder calculi.  The cystoscope was removed and a 28-French resectoscope sheath was placed. Using water as the irrigant, I fragmented the bladder calculi into multiple smaller fragments which were then irrigated with the bladder evacuator.  Multiple treatments with the laser were performed, fragmenting the stones small enough to pass through the resectoscope sheath.  After final identification of the bladder, no further stones were seen.  There was a fair amount of hematuria due to the  large prostate, which was expected.  The patient is also on aspirin.  At this point, the fragments were collected and placed in a specimen jar.  A 20- French Foley catheter was then placed transurethrally with a balloon inflated with 10 cc of water.  It was hooked to dependent drainage.  The patient tolerated procedure well.  He was awakened and then taken to the PACU in stable condition.     [name], [name]     SMD/MEDQ  D:  03/28/2011  T:  03/29/2011  Job:  [id number]  cc:   [name], [name] Fax: [phone]  [name], MD Fax: [phone]  Electronically Signed by [name] on 03/29/2011 02:43:45 PM

## 2011-03-29 NOTE — Consult Note (Signed)
NAMEMarland Kitchen  SALIK, GREWELL NO.:  [id number]  MEDICAL RECORD NO.:  [id number]  LOCATION:  5526                         FACILITY:  [facility]  PHYSICIAN:  Bertram Millard. Elyanna Wallick, M.D.DATE OF BIRTH:  [birth year]  DATE OF CONSULTATION:  03/27/2011 DATE OF DISCHARGE:                                CONSULTATION   REASON FOR CONSULTATION:  Right hydronephrosis with renal insufficiency.  BRIEF HISTORY:  This is a 70 year old male who was originally seen by me in November 2011 for an elevated PSA.  At that time, his PSA was 4.3. It was rechecked in November 2011, and returned almost 19.  After antibiotics, the PSA decreased to under 7.  The patient was found to have a very large prostate.  He also has bladder calculi.  During one of the episodes in this year, the patient's urinary residual volume was 300 mL.  There is also a small cyst in the right lower pole.  With the patient's significant symptomatology, large prostate as well as bladder calculi, it is noted in May 2012.  I recommended that he strongly consider cystolitholapaxy and TURP.  The patient had not gotten back with me.  Over the past few weeks, the patient had been feeling bad, he had right lower quadrant pain, and nausea and vomiting.  He presented recently to medical attention and was found to have significant renal insufficiency. The patient's creatinine was approximately 6.  His potassium was 6.2. He has had full emergent management of hyperkalemia and renal insufficiency.  During the hospitalization, he was found to have right hydronephrosis, which on serial renal ultrasounds has worsened.  There is no evidence of ureteral stone, but the ureters were not fully evaluated.  No ureteral jet was seen in the bladder on the right side.  The patient's creatinine was down to 5 range.  He was feeling better.  The patient's urinary residual on renal ultrasound was measured and is about 80 mL recently.  Urologic  consultation is requested for his right hydronephrosis and bladder outlet obstruction.  His medical history is significant for type 2 diabetes, hypercholesterolemia, neuropathy, elevation of his PSA, hypertension, small renal cyst, and history of left inguinal hernia.  He had a small sebaceous cyst excised in February 2012.  He has had release of Dupuytren contractures in September 2011.  In 2000, he had a right Dupuytren contracture release.  MEDICATIONS ON ADMISSION:  Norvasc 2.5 mg daily, aspirin, vitamin D, insulin, and Protonix.  He denies any drug allergies.  The patient is married.  He has children.  He is fairly active and recently has traveled quite a bit.  He has no history of cigarette use.  FAMILY HISTORY:  Significant for cancer and diabetes.  The patient has fatigue, right lower quadrant pain, nausea, and vomiting.  The strain has been intermittent at times.  He has not had gross hematuria.  He has had no chest pain.  He has had no cough or sputum production.  He has had a cystoscopy, which revealed his prostatic urethra to be approximately 5.5 cm in length.  Prostate was 3+ in size without nodularity or tenderness.  There were no rectal mass.  Anal sphincter tone was  normal.  External genitalia were normal.  There were no inguinal hernias.  I reviewed the patient's renal ultrasound.  There is significant right hydronephrosis.  Hyperechoic area is layering posteriorly on the bladder.  There is very large prostate.  IMPRESSION: 1. Benign prostatic hypertrophy without obstruction.  The patient is     managing this fairly well, and has been on Cialis in the past.  He     has bladder calculi as the sequelae. 2. Right hydronephrosis.  This may be due to his passing ureteral     stone, from bladder outlet obstruction, or just from local pressure     from the prostate. 3. History of pyuria, status post treatment with antibiotics. 4. Elevated PSA with more recent  normal value.  Elevation is most     likely secondary to his very large prostate. 5. Acute renal insufficiency, probably multifactorial.  The patient     does have hydronephrosis.  PLAN: 1. I spoke with the patient, his wife and daughter about further     management.  I think it is worthwhile to start with a CT urogram     concerning his possible . 2. The patient has a stone, I think the first step would be performing     cystoscopy, possible ureteroscopy versus stent placement. 3. If the patient does not have a stone, would likely the obstruction     will be related to his BPH.  In that case, I think we should     proceed at some point with TURP and cystolitholapaxy.  Rate of a     stent sooner or later would depend on the appearance of the CT     scan.  I will follow during this hospitalization.     Bertram Millard. Tarissa Kerin, [name]     SMD/MEDQ  D:  03/27/2011  T:  03/28/2011  Job:  [id number]  cc:   Jake Bathe, MD [name], [name]  Electronically Signed by [name] on 03/29/2011 02:44:59 PM

## 2011-03-30 LAB — CBC
HCT: 35.1 % — ABNORMAL LOW (ref 39.0–52.0)
MCHC: 33.9 g/dL (ref 30.0–36.0)
RDW: 12.7 % (ref 11.5–15.5)

## 2011-03-30 LAB — RENAL FUNCTION PANEL
Albumin: 3.3 g/dL — ABNORMAL LOW (ref 3.5–5.2)
BUN: 29 mg/dL — ABNORMAL HIGH (ref 6–23)
Creatinine, Ser: 1.77 mg/dL — ABNORMAL HIGH (ref 0.50–1.35)
Phosphorus: 3 mg/dL (ref 2.3–4.6)
Potassium: 4.4 mEq/L (ref 3.5–5.1)

## 2011-04-19 NOTE — Consult Note (Signed)
NAME:  Kevin, Clements NO.:  [id number]  MEDICAL RECORD NO.:  [id number]  LOCATION:  2013                         FACILITY:  [facility]  PHYSICIAN:  Cecille Aver, M.D.DATE OF BIRTH:  March 08, 1941  DATE OF CONSULTATION:  03/24/2011 DATE OF DISCHARGE:                                CONSULTATION   REQUESTING PHYSICIAN:  [name], MD  REASON FOR CONSULTATION:  Acute on chronic renal failure.  HISTORY OF PRESENT ILLNESS:  Kevin Clements is a 70 year old white male with past medical history significant for hypertension, diabetes mellitus, hyperlipidemia, followed by Dr. Selena Batten and Dr. Sharl Ma.  He also has chronic pyuria and multiple bilateral renal stones followed by Dr. Retta Diones with consideration for a TURP/cystolithopexy.  REVIEW OF OUTPATIENT LABORATORY DATA:  Dates and creatinine level. 1. October 2009 - creatinine 1.75. 2. July 2011 - creatinine 1.54. 3. November 2011 - creatinine 1.46. 4. April 2012 - creatinine 1.8.  The patient tells me that subsequent     to that creatinine has been down to 1.6.  He had been referred to     Dr. Caryn Section at [facility] for further management but     has not seen him yet, appointment is on April 07, 2011.  The patient     is very functional and travels and was noted approximately 2 weeks     ago to have the beginnings of a GI illness.  It may be was set off     from some questionable food.  He continued to have symptoms off and     on and presented to his primary on March 22, 2011 with     nausea/vomiting/right lower quadrant pain and diarrhea.  Labs     showed creatinine of 6 and he was admitted to [facility].     Creatinine since admission has been up and down but did come down     to 5.3 today.  He has also had persistent and recurrent     hyperkalemia being treated with Kayexalate.  The patient was on     lisinopril and hydrochlorothiazide prior to admission and also     received at least 2 doses of Aleve.   He has been taken off of all     those medications and has been hydrated since being admitted.  The     patient states that he actually feels good and back to normal.  PAST MEDICAL HISTORY: 1. Hypertension. 2. Diabetes. 3. Hyperlipidemia. 4. CKD stage III as above with baseline around 1.8. 5. Chronic pyuria and nonobstructing stones followed by Dr. Retta Diones.  CURRENT MEDICATIONS: 1. Norvasc 2.5 mg daily. 2. Aspirin. 3. Vitamin D. 4. Insulin. 5. Protonix 40 daily. 6. Kayexalate.  ALLERGIES:  No known drug allergies.  SOCIAL HISTORY:  The patient has a remote tobacco use.  He drinks wine usually daily.  He is retired, healthy, and travels.  FAMILY HISTORY:  Positive for diabetes and colorectal cancer.  No renal disease.  REVIEW OF SYSTEMS:  He reports he feels good.  He had previous abdominal cramping and nausea and vomiting which are now resolved.  He never experienced dizziness or decreased urine output or any  change in urine output.  He denies shortness of breath or chest pain.  Otherwise review of systems is negative.  PHYSICAL EXAMINATION:  VITAL SIGNS:  The patient is afebrile.  Blood pressure 156/83, heart rate 60, respirations 18, and oxygen saturation 100% on room air.  Urine output yesterday at least 1150.  The patient reports normal urine output.  Foley catheter is not in place.  GENERAL: The patient is alert and oriented and in no acute distress.  HEENT: Pupils are equal, round, and reactive to light.  Extraocular motions are intact.  Mucous membranes are moist.  NECK:  There is no jugular venous distention.  Carotid bruits or lymphadenopathy.  LUNGS:  Clear to auscultation bilaterally without wheezes.  CARDIOVASCULAR:  Regular rate rhythm without murmur, gallop, or rub.  ABDOMEN:  Soft, nontender, and nondistended.  EXTREMITIES:  No peripheral edema.  NEURO:  The patient is alert and oriented and the remainder of exam is nonfocal.  LABORATORY DATA:  White  blood count 5.8, hemoglobin 11.3, and platelets 178.  Sodium 137, potassium 6.7, bicarb 18, BUN and creatinine 80 and 5.35 which overall is a slight improvement since admission of 98 and 6, glucose 102, albumin 3.0, and calcium 7.2.  Urinalysis is completely bland negative for blood or protein.  C. diff is negative.  Renal ultrasound shows a right kidney size of 12.3 cm of noted moderate hydro without obstructing stone.  Left kidney is 10.5 cm with no hydro. Bladder does not appear to be distended.  ASSESSMENT:  This is a 70 year old white male with acute on chronic renal failure with hyperkalemia. 1. Acute on chronic renal failure.  Fortunately, nonoliguric and     creatinine has trended down albeit slowly since admission.  I think     all could still be consistent with volume depletion secondary to     gastrointestinal illness on lisinopril leading to acute tubular     necrosis and acute renal failure.  I agree with holding lisinopril,     hydrochlorothiazide, and hydrating.  I do not think he is     significantly dry any more  Regarding unilateral hydro, I am not sure if I would rush into placing a perc tube, especially with creatinine trending down but this could be impeding renal recovery.  I suggest reevaluation with another ultrasound on Monday.  If still hydro, get Dr. Lenoria Chime opinion. 1. Hyperkalemia.  Multiple doses of Kayexalate.  I agree with changing     to a bicarb based fluid to drive potassium back into cells.  I will     also give him Lasix x1 to eliminate more potassium via the urine.     The patient is on a low potassium diet.  Thank for this consultation.  We will follow with you.         ______________________________ Cecille Aver, [name]    KAG/MEDQ  D:  03/24/2011  T:  03/25/2011  Job:  [id number]  Electronically Signed by Annie Sable [name] on 04/19/2011 03:37:45 PM

## 2011-05-20 NOTE — Discharge Summary (Signed)
NAME:  Kevin Clements, STATZ NO.:  [id number]  MEDICAL RECORD NO.:  [id number]  LOCATION:  5526                         FACILITY:  [facility]  PHYSICIAN:  [name], MD        DATE OF BIRTH:  Jan 05, 1941  DATE OF ADMISSION:  03/22/2011 DATE OF DISCHARGE:  03/30/2011                              DISCHARGE SUMMARY   DISCHARGE DIAGNOSES: 1. Acute renal failure secondary to dehydration and right     hydronephrosis secondary to nephrolithiasis (obstructive uropathy)     and possible component of mild acute tubular necrosis. 2. Nausea, vomiting, diarrhea likely secondary to viral     gastroenteritis, resolved. 3. Hyperkalemia, resolved. 4. Type 2 diabetes. 5. Hypertension. 6. Hyperlipidemia. 7. Diabetic neuropathy. 8. Prostate specific antigen elevation/benign prostatic hyperplasia. 9. Nephrolithiasis. 10.Osteopenia. 11.Vitreous floater. 12.Left inguinal hernia. 13.History of sebaceous cyst, status post excision. 14  History of Dupuytren contracture, status post surgery. 1. History of colonic polyp.  DISCHARGE MEDICATIONS: 1. Amlodipine 5 mg p.o. daily. 2. Carvedilol 3.125 mg p.o. b.i.d. 3. Flomax 0.4 mg p.o. daily. 4. Avodart 0.5 mg p.o. daily. 5. Enteric-coated aspirin hold x1 week and then resume. 6. Calcium carbonate with D 600/400 IU one p.o. b.i.d. 7. Cialis 5 mg p.o. daily p.r.n. 8. Fish oil 1200 mg p.o. b.i.d. 9. Fosamax 70 mg p.o. every week. 10.Amaryl 1 mg p.o. q.a.m. and 2 mg p.o. q.p.m. 11.Multivitamin 1 p.o. daily. 12.Prilosec 20 mg p.o. daily. 13.Vitamin D 2000 international units p.o. daily. 14.Zocor 20 mg p.o. nightly.  HOSPITAL COURSE:  A 70 year old male with a history of diabetes type 2, chronic kidney disease stage III, baseline creatinine 1.8 (apparently complained of nausea, vomiting and then subsequent diarrhea starting about 10 days prior to his admission on March 22, 2011.)  He noted he had used some NSAIDs.  The patient came to the  office and was seen by our nurse practitioner and was found to have a creatinine of 6.5 with a BUN of over 100.  The patient was sent to the hospital for admission and was admitted on March 22, 2011.  At that point in time, his urinalysis was relatively clear.  There were no evidence of any casts.  Urine sodium was 77.  Urine eosinophils were ordered, but not performed.  Urine by nursing staff urine creatinine was 47.98.  The patient's BUN and creatinine on admission was 97 and 6.02.  CPK was within normal limits at 102.  Urine protein electrophoresis was negative as well as serum protein electrophoresis.  Renal ultrasound on admission showed evidence of right hydronephrosis.  Initial attempts were made to hydrate the patient with normal saline and then this was switched over to D5W with 3 ampules of bicarb due to the fact that he continued to have problems with hyperkalemia as well as some diarrhea.  The patient's creatinine improved.  However, it did not reach baseline.  It trended down towards about 5.0.  Since it was not improving to his baseline, a repeat ultrasound was performed on March 27, 2011, as per Nephrology input. This showed persistent right hydronephrosis.  At that point in time, Urology was consulted and a CT of the abdomen and pelvis  without contrast was performed and showed evidence of nephrolithiasis.  As a cause of obstruction, Urology was kind enough to take the patient to the OR and performed cystoscopy with right ureteroscopy with holmium laser and extraction of right ureteral stone and a right double-J stent placement 6 x 26 cm cystolitholapaxy with laser and bladder calculi. Foley catheter was removed after cystoscopy, however, the patient was unable to void and so was replaced.  The patient was continued on Flomax and Avodart has been added.  The patient's creatinine trended downwards after removal of the stone and on day of discharge is 1.77.  CONSULTATIONS:   Urology, Bertram Millard. Retta Diones, MD, Nephrology, Cecille Aver, MD and Terrial Rhodes, MD.  PROCEDURES: 1. On March 28, 2011, cystoscopy, right ureteroscopy with holmium laser     and extraction of right ureteral stone, right double-J stent     placement, cystolitholapaxy with laser and bladder calculi,     placement of Foley catheter. 2. CT scan of the abdomen and pelvis March 27, 2011, 7-mm right UVJ     stone with moderate right hydronephrosis, sigmoid and colonic     diverticulosis, BPH, multiple stones in the bladder, bilateral     nephrolithiasis.  Renal ultrasound March 27, 2011, stable moderate     right hydronephrosis increased since April.  Renal ultrasound March 22, 2011, right hydronephrosis, left renal cyst marked prostatic     gland enlargement with bladder calculi.  PHYSICAL EXAMINATION:  VITAL SIGNS:  Temperature 97.4, pulse 77, blood pressure 127/70 and pulse ox is 98% on room air. HEENT:  Anicteric. NECK:  No JVD. HEART:  Regular rate and rhythm, S1 and S2. LUNGS:  CTAB. ABDOMEN:  Soft. EXTREMITIES:  No cyanosis, clubbing or edema.  Foley is in place.  LABORATORY DATA:  March 30, 2011, sodium 140, potassium 4.4, BUN 29, creatinine 1.77, WBC 6.6, hemoglobin 11.9, platelet count 145.  March 22, 2011, PTH 168, calcium 8.6.  March 23, 2011, C. Diff negative by PCR. Hemoccult stool negative.  ASSESSMENT AND PLAN: 1. Acute renal failure on chronic kidney disease (baseline creatinine     1.8).  The patient will follow up with Dr. Caryn Section on April 09, 2011.  We     appreciate his input.  Renal failure was likely secondary to     dehydration, possible mild acute tubular necrosis and also right     ureteral obstruction by nephrolithiasis. 2. Benign prostatic hyperplasia, nephrolithiasis:  We appreciate Dr.     Viviann Spare Dahlstedt's input on the right nephrolithiasis which was     causing right hydronephrosis as well as bladder stones and benign     prostatic hyperplasia.   The patient was started on Flomax 0.4 mg     p.o. nightly during this admission and also started on Avodart 0.5     mg p.o. daily.  The patient has been given samples and will follow     up with Dr. Retta Diones in about 1 week.  Hold aspirin x1 week and     then restart. 3. Hypertension.  The patient was discontinued off of lisinopril and     hydrochlorothiazide during this admission.  The patient's blood     pressure has been well controlled with amlodipine 5 mg p.o. daily     and carvedilol 3.125 mg p.o. b.i.d.  He will continue these blood     pressure medications. 4. Hyperlipidemia.  Continue Zocor 20 mg p.o. nightly. 5. Osteopenia:  Continue vitamin D calcium, continue Fosamax. 6. Diabetes type 2.  The patient will restart his glimepiride     tomorrow. 7. Gastroesophageal reflux disease:  Continue Prilosec.     [name], MD     JYK/MEDQ  D:  03/30/2011  T:  03/30/2011  Job:  [id number]  cc:   Bertram Millard. Dahlstedt, [name] Jasper F. Caryn Section, [name] Cecille Aver, [name] Terrial Rhodes, [name]  Electronically Signed by Pearson Grippe MD on 05/20/2011 02:22:27 PM

## 2011-05-20 NOTE — H&P (Signed)
NAME:  Kevin Clements, Kevin Clements NO.:  [id number]  MEDICAL RECORD NO.:  [id number]  LOCATION:  2013                         FACILITY:  [facility]  PHYSICIAN:  [name], MD        DATE OF BIRTH:  May 06, 1941  DATE OF ADMISSION:  03/22/2011 DATE OF DISCHARGE:                             HISTORY & PHYSICAL   CHIEF COMPLAINT:  Acute renal failure.  HISTORY OF PRESENT ILLNESS:  A 70 year old male with type 2 diabetes, hypertension, hyperlipidemia, diabetic neuropathy, renal insufficiency, apparently complained of feeling nauseated and vomiting about 10 days ago.  Subsequently, he had some mild right lower abdominal discomfort, which he described as cramping and then proceeded to have diarrhea.  The patient notes that he has had some NSAID use, but his diarrhea appears to have been his most prominent symptom.  The patient was having diarrhea about 3-4 times per day.  The patient denies any constipation, bright red blood per rectum or black stool.  The patient was seen in outpatient clinic by a nurse practitioner and routine labs were performed and the patient was found to have a creatinine of about 6. The patient was therefore sent to the hospital for admission for acute renal failure.  The patient does note that he has been holding his glimepiride due to the fact that he has had some low blood sugars.  PAST MEDICAL HISTORY: 1. Type 2 diabetes. 2. Hypertension. 3. Hyperlipidemia. 4. Diabetic neuropathy. 5. PSA elevation. 6. Osteopenia. 7. Vitreous floater. 8. Left inguinal hernia.  PAST SURGICAL HISTORY: 1. Sebaceous cyst status post excision, November 14, 2010. 2. September 2011, left Dupuytren contracture by Dr. Mina Marble, 2009,     colonoscopy by Buccini - polyp. 3. August 2000 right Dupuytren contracture by Dr. Mina Marble, March 20, 1998, cyst on back removed partially by Dr. Danna Hefty 4. T and A at age 43.  SOCIAL HISTORY:  The patient is a remote smoker.   He drinks about 1 glass of wine per day.  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  He was born in [place], [place].  Hobbies include he knows car racing.  FAMILY HISTORY:  Mother died at age 76 from colorectal cancer, had diabetes.  Father died at age 79 from CHF.  No heart attacks (former smoker).  One sister "Nicholos Johns" has diabetes, has cochlear implant and some surgery on her ear for acoustic neuroma.  His maternal grandmother died of internal cancer in her 84s and maternal grandfather was an alcoholic.  Paternal grandmother and grandfather died of ?  He is not sure about their medical history.  One son age 67, [place], had a muscle cancer and is TEFL teacher in [place] (sarcoma), no XRT, no chemo just excision.  One daughter age 25, Stanton Kidney has skin cancer, nonmelanoma and lives at [place].  One daughter age 65, Cala Bradford, is healthy and lives in [place].  ALLERGIES:  No known drug allergies.  MEDICATIONS:  See MAR, reviewed.  (Note that specifically he is on lisinopril and hydrochlorothiazide).  REVIEW OF SYSTEMS:  His diabetes was diagnosed about 15 years ago and he was previously on metformin and he have to stop  due to increased creatinine.  He is on Actos for a short time, but then quit.  He has been taking part in a COPD [facility].  Review of systems is otherwise negative for all 10-organ systems except for pertinent positives as stated above.  PHYSICAL EXAMINATION:  VITAL SIGNS:  Height 6 feet.  Temperature 97.7, pulse 80, blood pressure 162/76, pulse ox is 99% on room air. HEENT:  Anicteric.  Mucous membranes dry. NECK:  No JVD. HEART:  Regular rate and rhythm.  S1 and S2. LUNGS:  CTAB. ABDOMEN:  Soft. EXTREMITIES:  No cyanosis, clubbing, or edema  LABORATORY DATA:  WBC 6.6, hemoglobin 12.2, platelet count is 180, magnesium 2.4, phosphorus 4.7.  Sodium 133, potassium 6.5, BUN 97, creatinine 6.02, AST 12, ALT 16, calcium 8.6, urine sodium  77.  ASSESSMENT AND PLAN: 1. Acute renal failure:  Check a urine sodium, urine creatinine, urine     eosinophils, renal ultrasound, urinalysis, probably need to involve     Nephrology consult in the a.m.  We will also check a CPK.  Consider     further workup with an SPEP and UPEP if creatinine continues to be     elevated.  We will hydrate with normal saline as I believe that the     patient is probably dehydrated from his diarrhea.  I think his     renal failure was multifactorial, being from his nausea, vomiting,     diarrhea which left him and dehydrated as well as his lisinopril     and hydrochlorothiazide which probably increased his dehydration.     He may also have an element of obstruction and renal ultrasound is     therefore going to be done. 2. Hyperkalemia:  Kayexalate.  Repeat potassium. 3. Diarrhea:  Check stool for fecal leukocytes, culture C. diff. 4. Diabetes type 2.  Fingerstick blood sugars a.c. and at bedtime.     NovoLog sensitive sliding scale.  Discontinue glimepiride. 5. Deep venous thrombosis prophylaxis.  SCDs.     [name], MD     JYK/MEDQ  D:  03/23/2011  T:  03/23/2011  Job:  [id number] Electronically Signed by Pearson Grippe MD on 05/20/2011 02:22:12 PM

## 2011-05-30 ENCOUNTER — Other Ambulatory Visit: Payer: Self-pay | Admitting: Gastroenterology

## 2011-10-24 DIAGNOSIS — IMO0001 Reserved for inherently not codable concepts without codable children: Secondary | ICD-10-CM | POA: Diagnosis not present

## 2011-10-24 DIAGNOSIS — E7800 Pure hypercholesterolemia, unspecified: Secondary | ICD-10-CM | POA: Diagnosis not present

## 2011-10-24 DIAGNOSIS — N181 Chronic kidney disease, stage 1: Secondary | ICD-10-CM | POA: Diagnosis not present

## 2011-10-24 DIAGNOSIS — I10 Essential (primary) hypertension: Secondary | ICD-10-CM | POA: Diagnosis not present

## 2011-10-26 DIAGNOSIS — N200 Calculus of kidney: Secondary | ICD-10-CM | POA: Diagnosis not present

## 2011-10-30 DIAGNOSIS — D649 Anemia, unspecified: Secondary | ICD-10-CM | POA: Diagnosis not present

## 2011-10-30 DIAGNOSIS — N2581 Secondary hyperparathyroidism of renal origin: Secondary | ICD-10-CM | POA: Diagnosis not present

## 2011-10-30 DIAGNOSIS — N183 Chronic kidney disease, stage 3 unspecified: Secondary | ICD-10-CM | POA: Diagnosis not present

## 2011-10-31 DIAGNOSIS — I129 Hypertensive chronic kidney disease with stage 1 through stage 4 chronic kidney disease, or unspecified chronic kidney disease: Secondary | ICD-10-CM | POA: Diagnosis not present

## 2011-10-31 DIAGNOSIS — N183 Chronic kidney disease, stage 3 unspecified: Secondary | ICD-10-CM | POA: Diagnosis not present

## 2011-10-31 DIAGNOSIS — N200 Calculus of kidney: Secondary | ICD-10-CM | POA: Diagnosis not present

## 2011-10-31 DIAGNOSIS — E119 Type 2 diabetes mellitus without complications: Secondary | ICD-10-CM | POA: Diagnosis not present

## 2011-11-02 DIAGNOSIS — D3130 Benign neoplasm of unspecified choroid: Secondary | ICD-10-CM | POA: Diagnosis not present

## 2011-11-02 DIAGNOSIS — H2510 Age-related nuclear cataract, unspecified eye: Secondary | ICD-10-CM | POA: Diagnosis not present

## 2011-11-02 DIAGNOSIS — H43819 Vitreous degeneration, unspecified eye: Secondary | ICD-10-CM | POA: Diagnosis not present

## 2011-11-02 DIAGNOSIS — H35419 Lattice degeneration of retina, unspecified eye: Secondary | ICD-10-CM | POA: Diagnosis not present

## 2011-11-02 DIAGNOSIS — E119 Type 2 diabetes mellitus without complications: Secondary | ICD-10-CM | POA: Diagnosis not present

## 2011-11-16 DIAGNOSIS — IMO0001 Reserved for inherently not codable concepts without codable children: Secondary | ICD-10-CM | POA: Diagnosis not present

## 2012-02-06 ENCOUNTER — Other Ambulatory Visit: Payer: Self-pay | Admitting: Physician Assistant

## 2012-02-06 DIAGNOSIS — D485 Neoplasm of uncertain behavior of skin: Secondary | ICD-10-CM | POA: Diagnosis not present

## 2012-02-06 DIAGNOSIS — L570 Actinic keratosis: Secondary | ICD-10-CM | POA: Diagnosis not present

## 2012-02-06 DIAGNOSIS — L723 Sebaceous cyst: Secondary | ICD-10-CM | POA: Diagnosis not present

## 2012-02-06 DIAGNOSIS — D0439 Carcinoma in situ of skin of other parts of face: Secondary | ICD-10-CM | POA: Diagnosis not present

## 2012-02-06 DIAGNOSIS — D0430 Carcinoma in situ of skin of unspecified part of face: Secondary | ICD-10-CM | POA: Diagnosis not present

## 2012-02-15 DIAGNOSIS — D0430 Carcinoma in situ of skin of unspecified part of face: Secondary | ICD-10-CM | POA: Diagnosis not present

## 2012-02-15 DIAGNOSIS — D0439 Carcinoma in situ of skin of other parts of face: Secondary | ICD-10-CM | POA: Diagnosis not present

## 2012-03-06 DIAGNOSIS — R635 Abnormal weight gain: Secondary | ICD-10-CM | POA: Diagnosis not present

## 2012-03-06 DIAGNOSIS — I10 Essential (primary) hypertension: Secondary | ICD-10-CM | POA: Diagnosis not present

## 2012-03-06 DIAGNOSIS — IMO0001 Reserved for inherently not codable concepts without codable children: Secondary | ICD-10-CM | POA: Diagnosis not present

## 2012-03-06 DIAGNOSIS — E7800 Pure hypercholesterolemia, unspecified: Secondary | ICD-10-CM | POA: Diagnosis not present

## 2012-03-11 DIAGNOSIS — E119 Type 2 diabetes mellitus without complications: Secondary | ICD-10-CM | POA: Diagnosis not present

## 2012-03-11 DIAGNOSIS — E7800 Pure hypercholesterolemia, unspecified: Secondary | ICD-10-CM | POA: Diagnosis not present

## 2012-03-11 DIAGNOSIS — N400 Enlarged prostate without lower urinary tract symptoms: Secondary | ICD-10-CM | POA: Diagnosis not present

## 2012-03-11 DIAGNOSIS — I10 Essential (primary) hypertension: Secondary | ICD-10-CM | POA: Diagnosis not present

## 2012-03-21 IMAGING — CR DG CHEST 2V
2 series · 2 of 2 positions shown · non-contrast
Comparison: Spine [DATE]

CLINICAL DATA: Admission evaluation for chest wall mass.  No
complaints

CHEST - 2 VIEW

[view not recorded (1 of 2)]
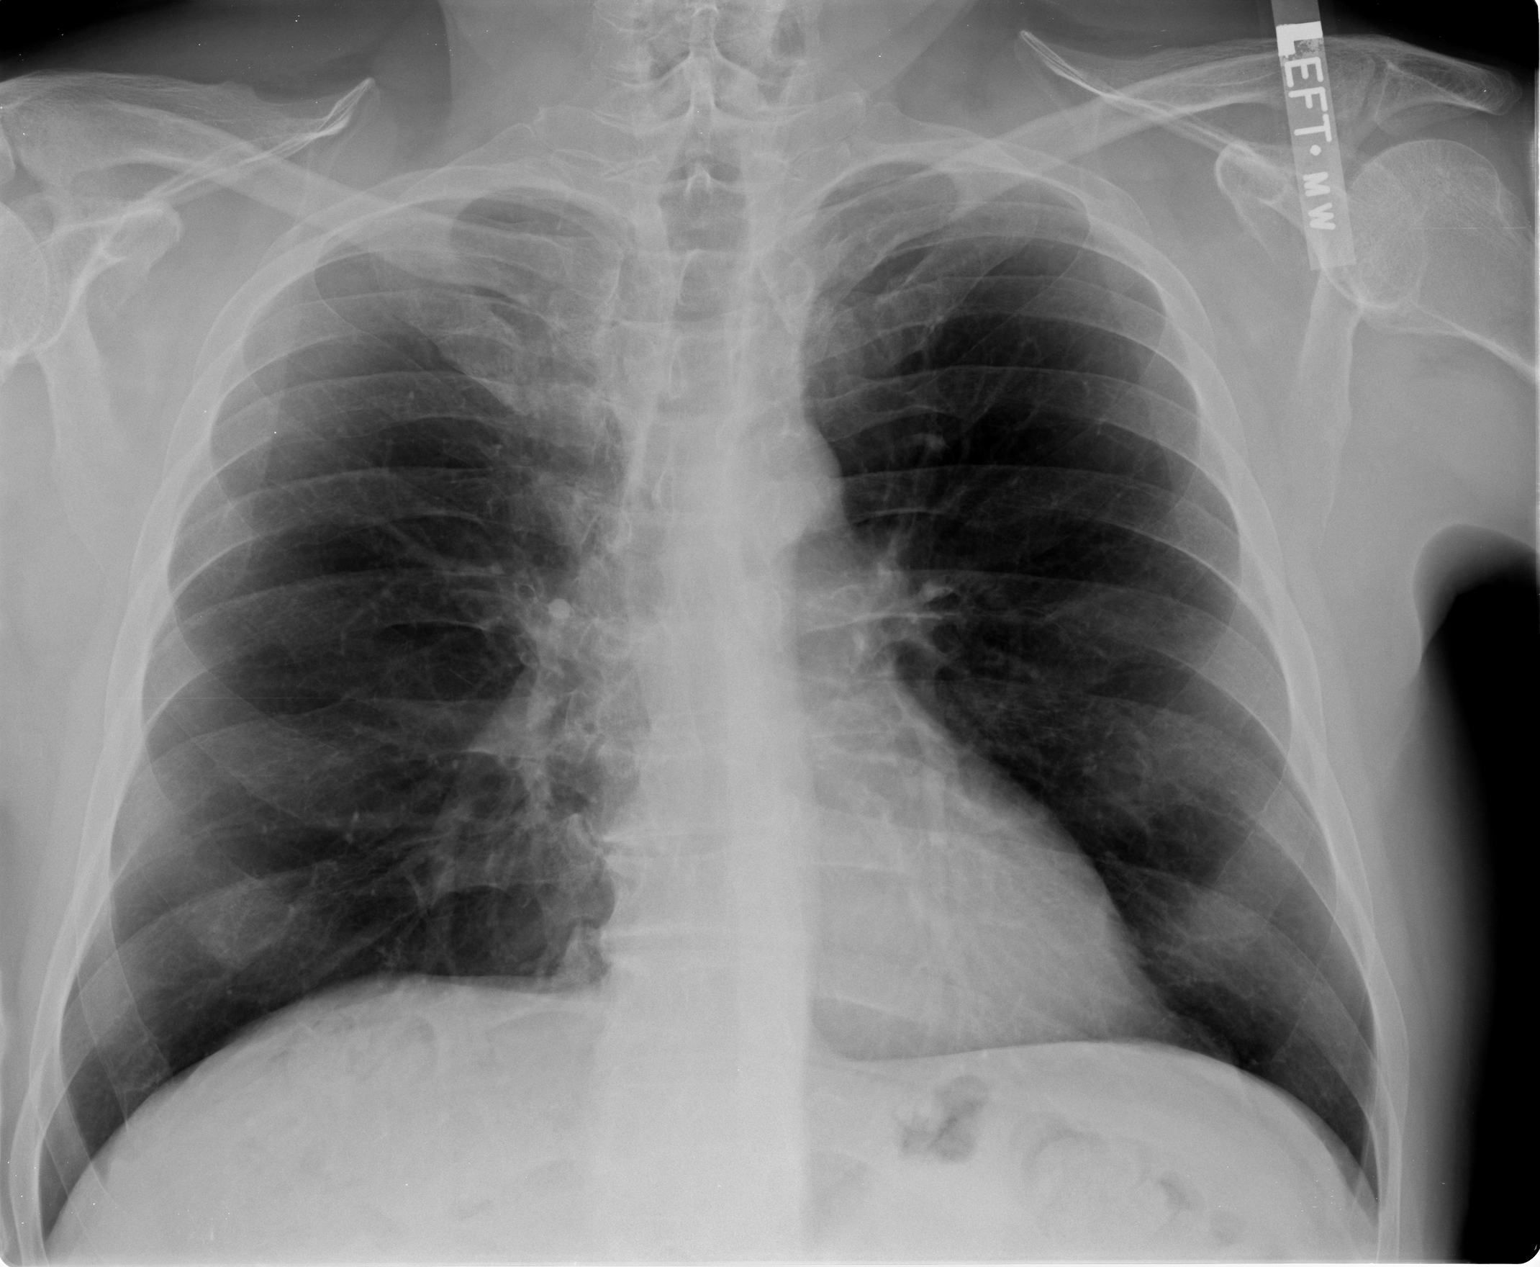

[view not recorded (2 of 2)]
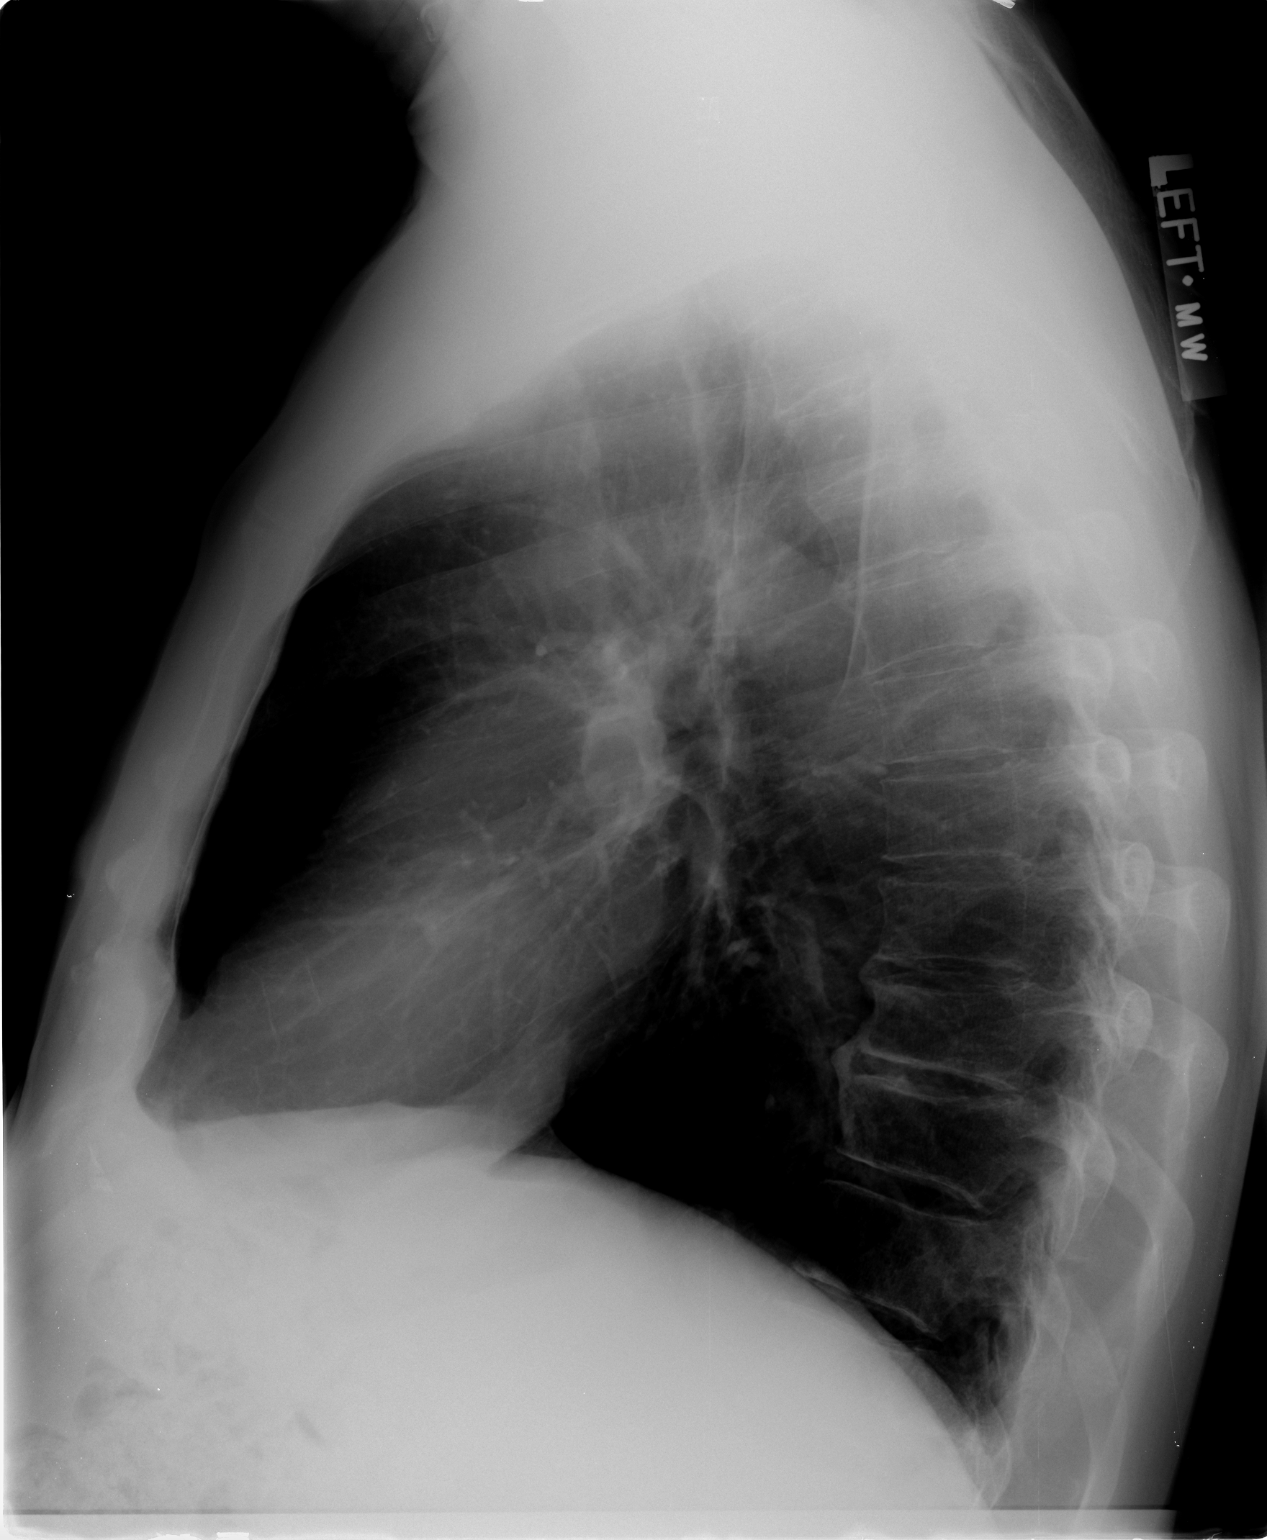

[2 of 2 positions shown; findings below may reference images not displayed]

FINDINGS: Heart and mediastinal contours are within normal limits.
The lung fields are clear with no evidence for focal infiltrate or
congestive failure.  No pleural fluid or significant peribronchial
cuffing is seen.

Mild curvature of the thoracic spine is seen with degenerative
osteophytosis of the mid and lower spine.  A stable fracture of the
T9 vertebral body is identified.
IMPRESSION: No focal or acute cardiopulmonary abnormality noted.

## 2012-04-25 DIAGNOSIS — N183 Chronic kidney disease, stage 3 unspecified: Secondary | ICD-10-CM | POA: Diagnosis not present

## 2012-04-29 DIAGNOSIS — N183 Chronic kidney disease, stage 3 unspecified: Secondary | ICD-10-CM | POA: Diagnosis not present

## 2012-04-29 DIAGNOSIS — E119 Type 2 diabetes mellitus without complications: Secondary | ICD-10-CM | POA: Diagnosis not present

## 2012-04-29 DIAGNOSIS — I129 Hypertensive chronic kidney disease with stage 1 through stage 4 chronic kidney disease, or unspecified chronic kidney disease: Secondary | ICD-10-CM | POA: Diagnosis not present

## 2012-05-02 DIAGNOSIS — I10 Essential (primary) hypertension: Secondary | ICD-10-CM | POA: Diagnosis not present

## 2012-05-02 DIAGNOSIS — IMO0001 Reserved for inherently not codable concepts without codable children: Secondary | ICD-10-CM | POA: Diagnosis not present

## 2012-05-17 DIAGNOSIS — D235 Other benign neoplasm of skin of trunk: Secondary | ICD-10-CM | POA: Diagnosis not present

## 2012-05-17 DIAGNOSIS — H0019 Chalazion unspecified eye, unspecified eyelid: Secondary | ICD-10-CM | POA: Diagnosis not present

## 2012-05-17 DIAGNOSIS — L570 Actinic keratosis: Secondary | ICD-10-CM | POA: Diagnosis not present

## 2012-06-22 DIAGNOSIS — Z23 Encounter for immunization: Secondary | ICD-10-CM | POA: Diagnosis not present

## 2012-07-31 IMAGING — US US RENAL
1 series · 14 of 25 positions shown · non-contrast
Comparison: None

CLINICAL DATA: Renal failure

RENAL/URINARY TRACT ULTRASOUND COMPLETE

[Series 1: us renal · 0.25mm/px · 14 of 36 slices shown]
[im 1/36]
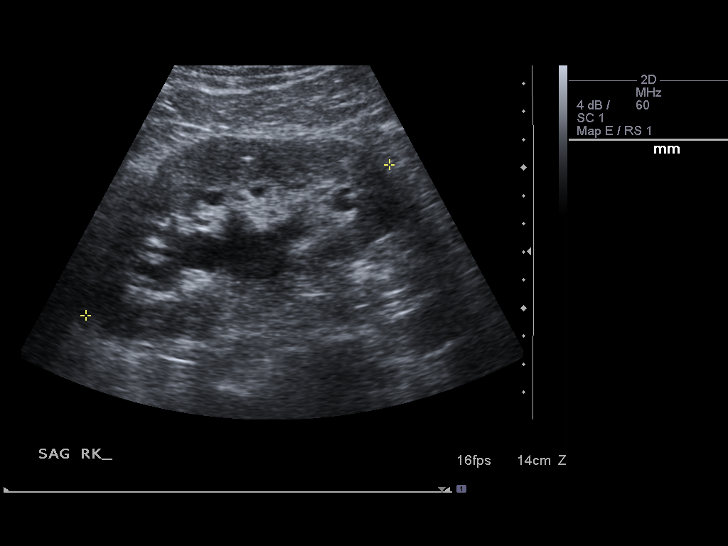
[im 3/36]
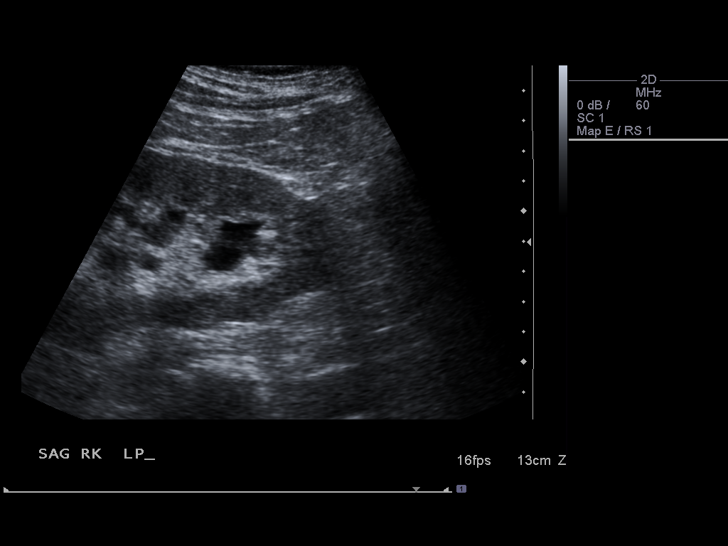
[im 6/36]
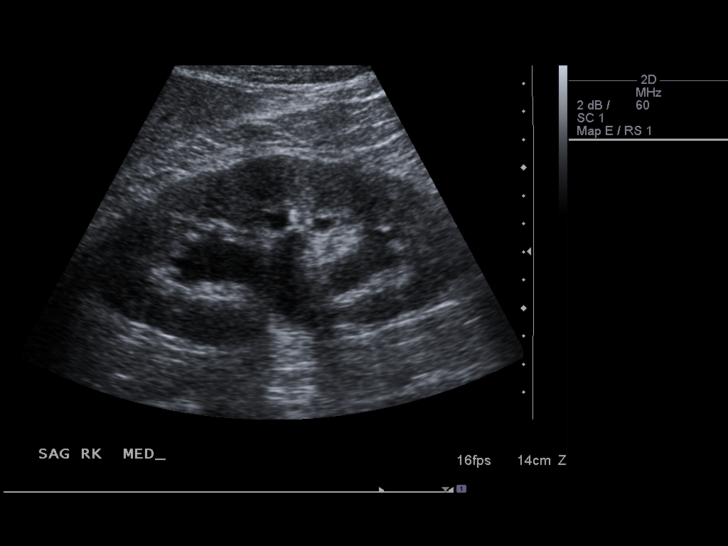
[im 9/36]
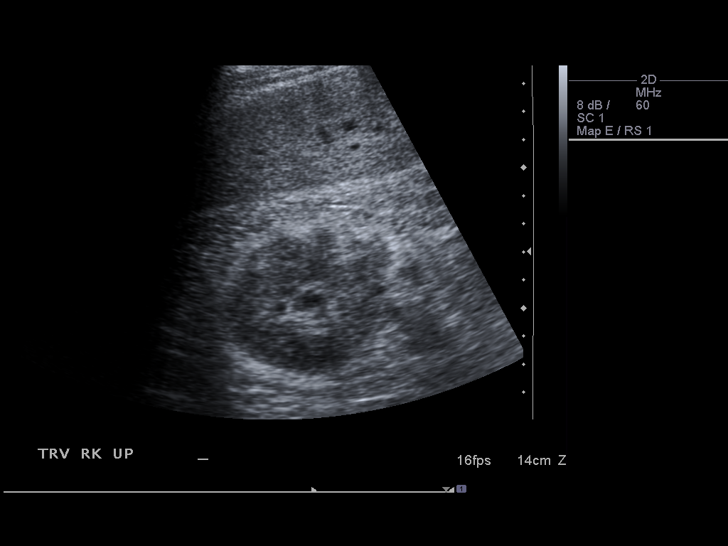
[im 12/36]
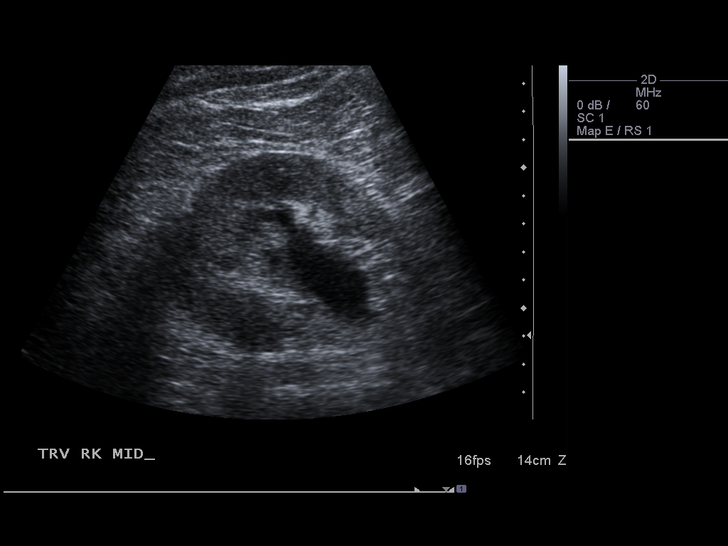
[im 14/36]
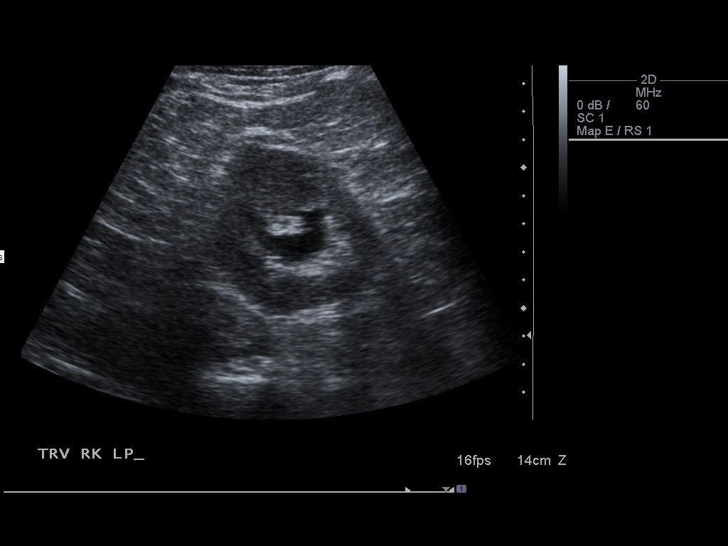
[im 17/36]
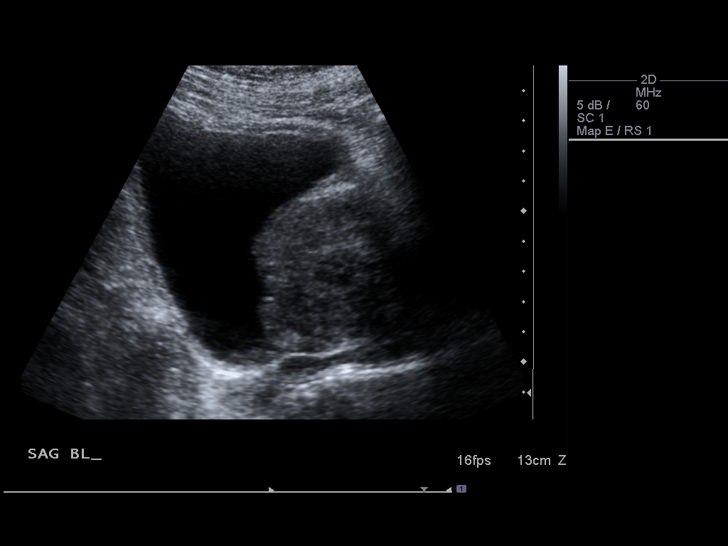
[im 19/36]
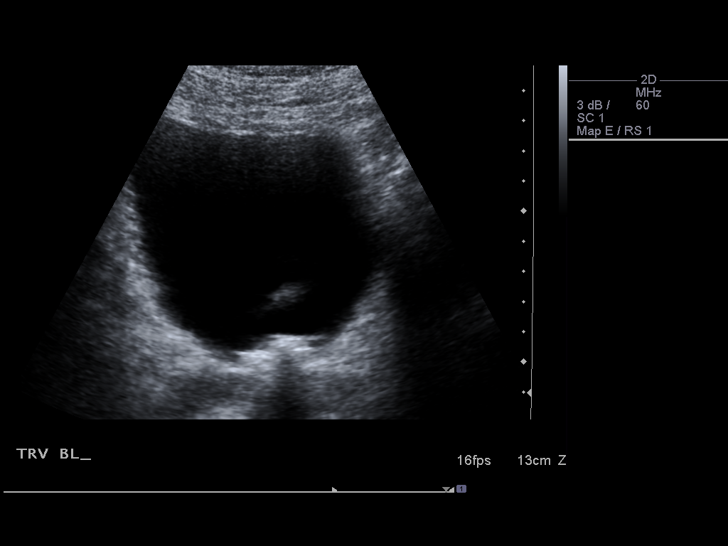
[im 22/36]
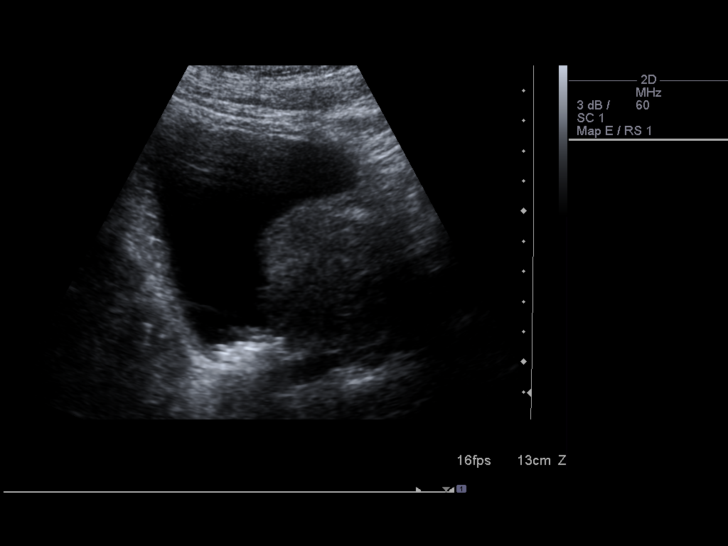
[im 24/36]
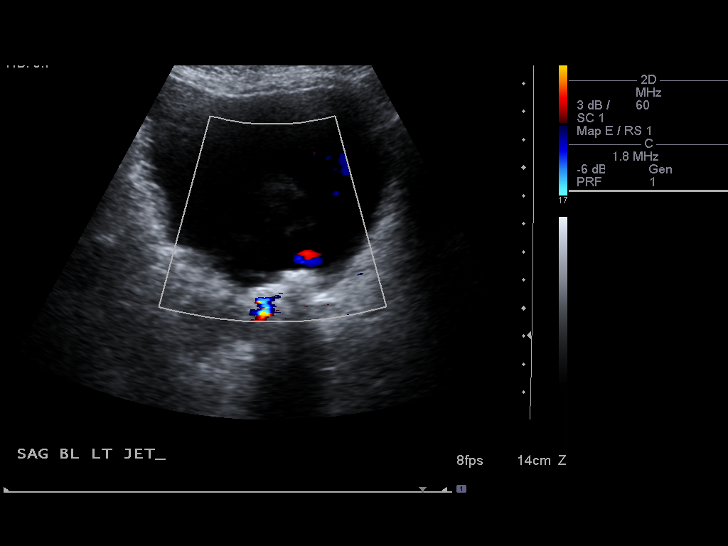
[im 27/36]
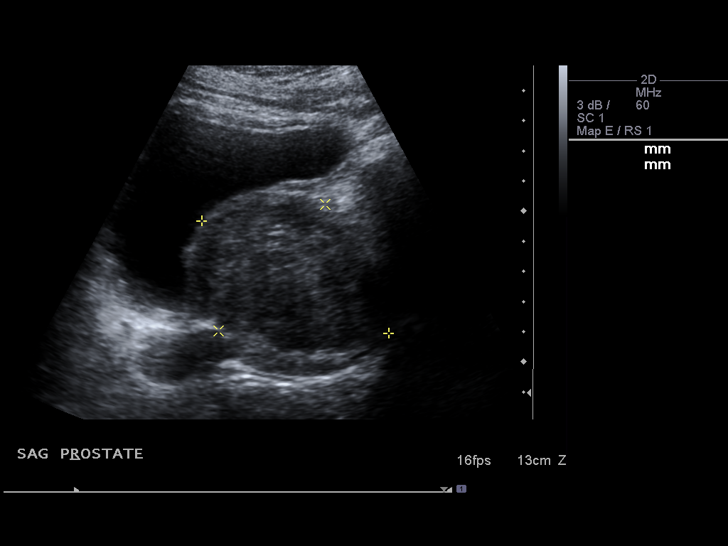
[im 30/36]
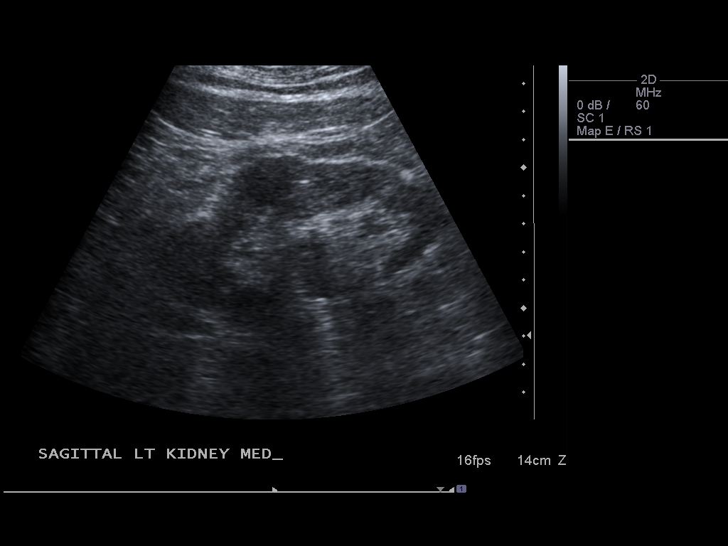
[im 33/36]
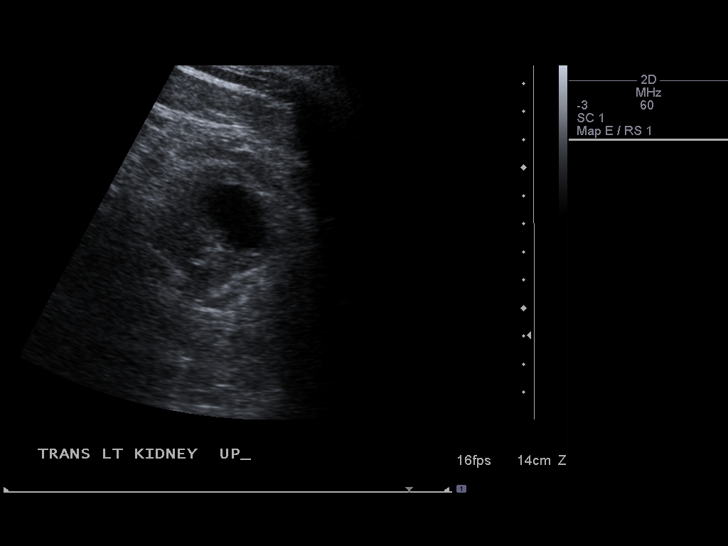
[im 36/36]
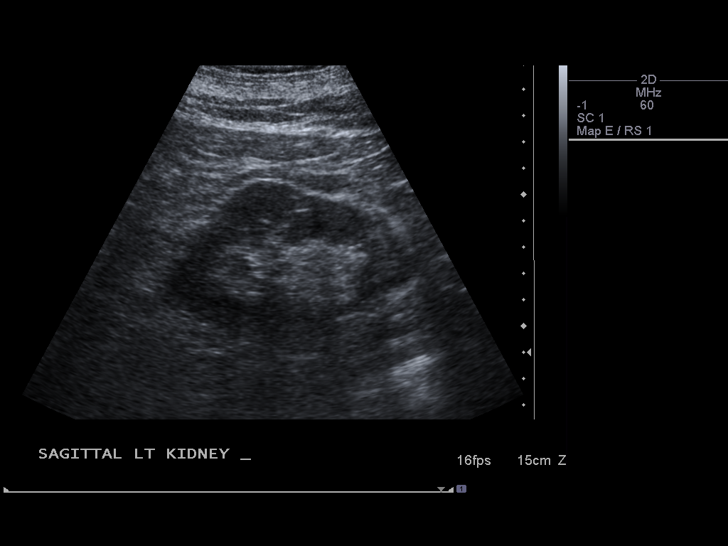

[14 of 25 positions shown; findings below may reference images not displayed]

FINDINGS: Right Kidney:  Measures 12.3 cm.  Normal in size and parenchymal
echogenicity. Moderate hydronephrosis noted.  No obstructing stone
identified.  No renal mass.

Left Kidney:  Measures 10.5 cm.  Normal in size and parenchymal
echogenicity. No hydronephrosis.  Contains a cyst within the upper
pole measuring 2.7 x 1.6 x 2.1 cm.

Bladder:  There is marked prostate gland enlargement measuring
x 5.5 x 6.3 cm.  Multiple stones are seen within the urinary
bladder.  These measure up to 1.8 cm.
IMPRESSION: 1.  Right hydronephrosis.
2.  Left renal cyst.
3.  Marked prostate gland enlargement
4.  Bladder calculi.

## 2012-08-05 IMAGING — US US RENAL
1 series · 13 of 25 positions shown · non-contrast
Comparison: 03/22/2011, 01/10/2011.

CLINICAL DATA: 70-year-old male with hydronephrosis on the right.

RENAL/URINARY TRACT ULTRASOUND COMPLETE

[Series 1: us renal · 0.30mm/px · 13 of 59 slices shown]
[im 1/59]
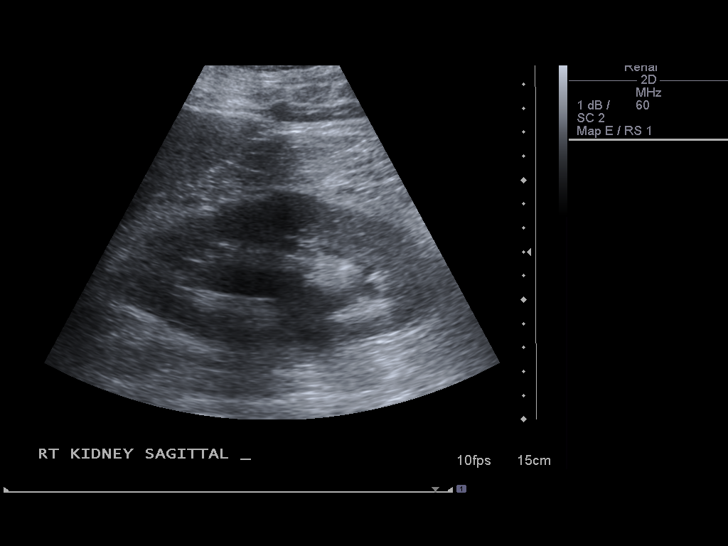
[im 5/59]
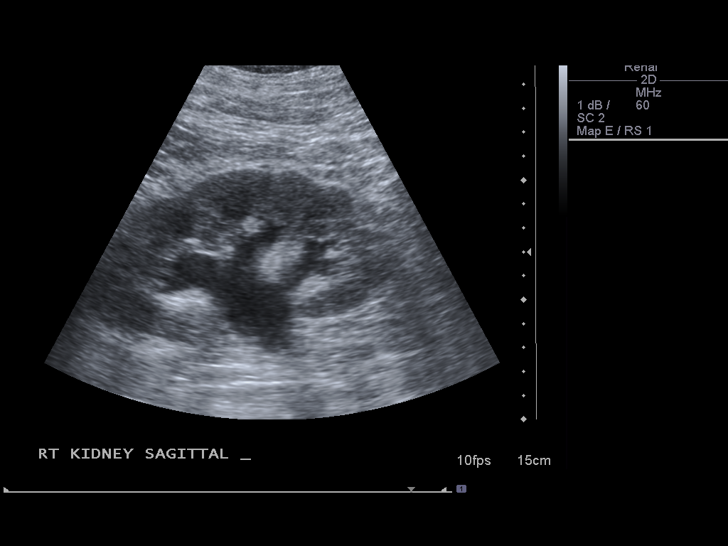
[im 10/59]
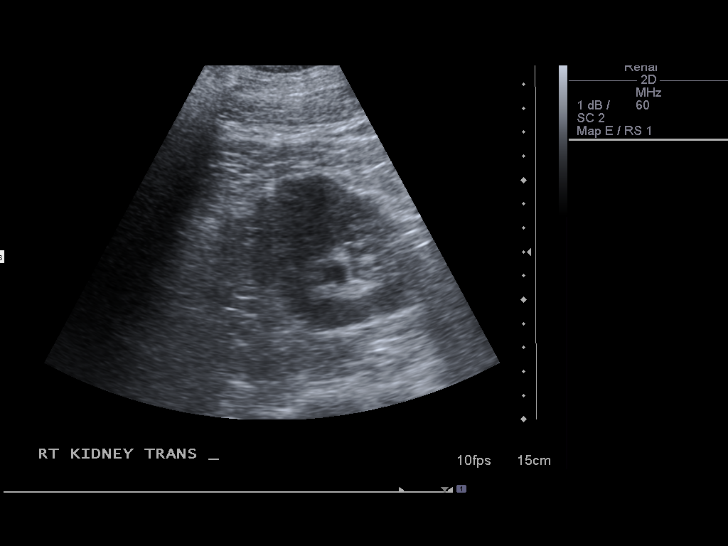
[im 15/59]
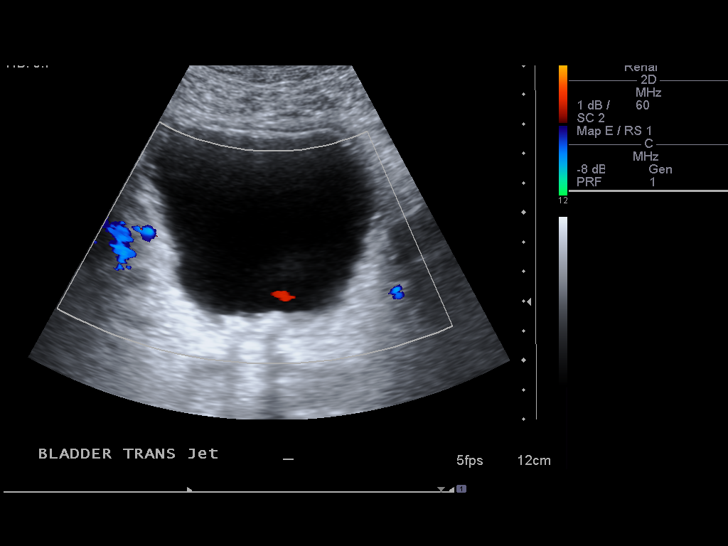
[im 20/59]
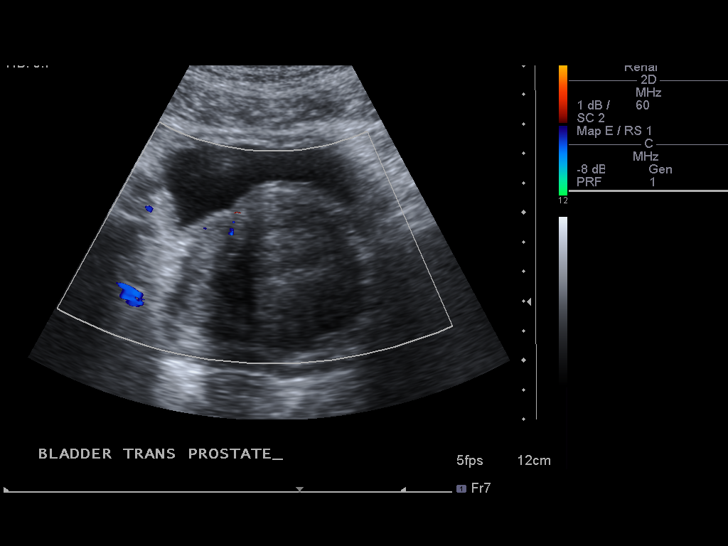
[im 25/59]
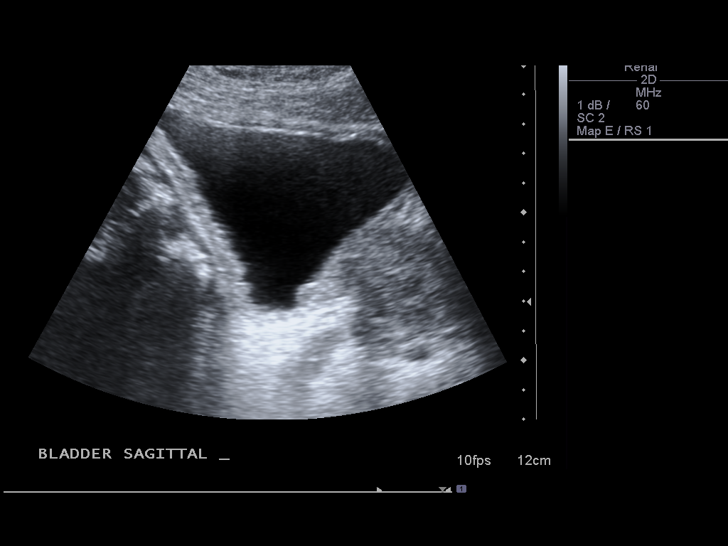
[im 30/59]
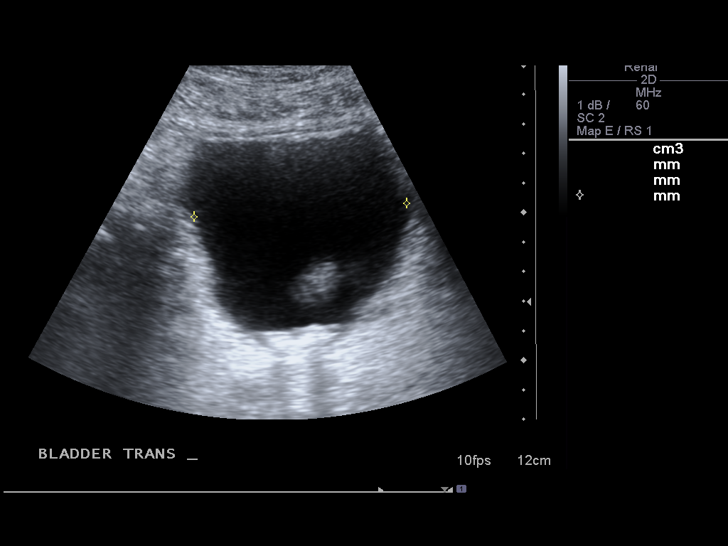
[im 34/59]
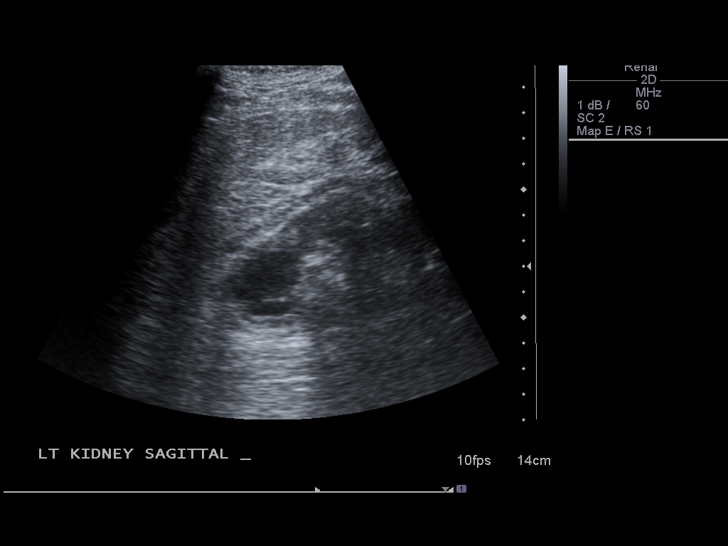
[im 39/59]
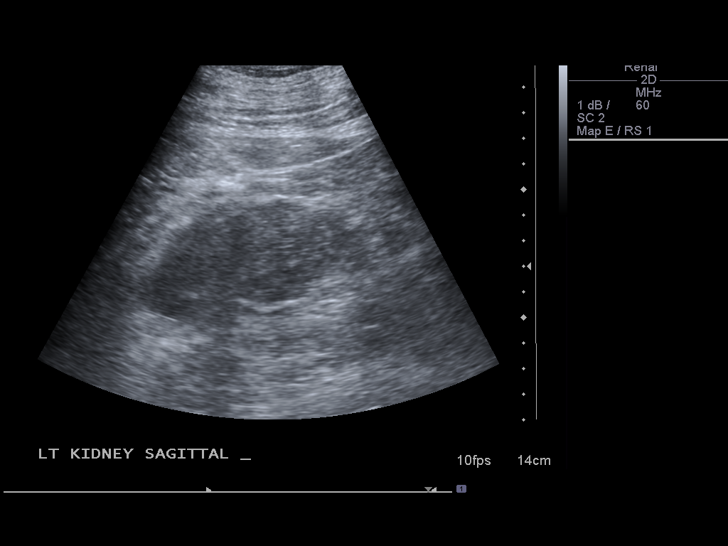
[im 44/59]
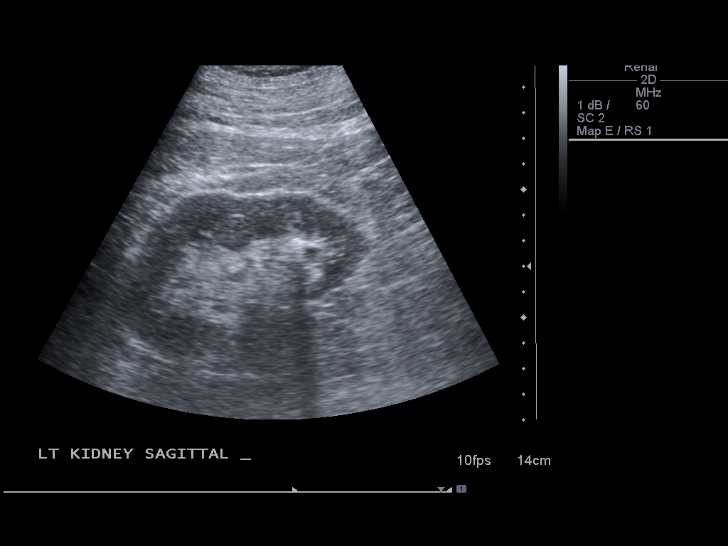
[im 49/59]
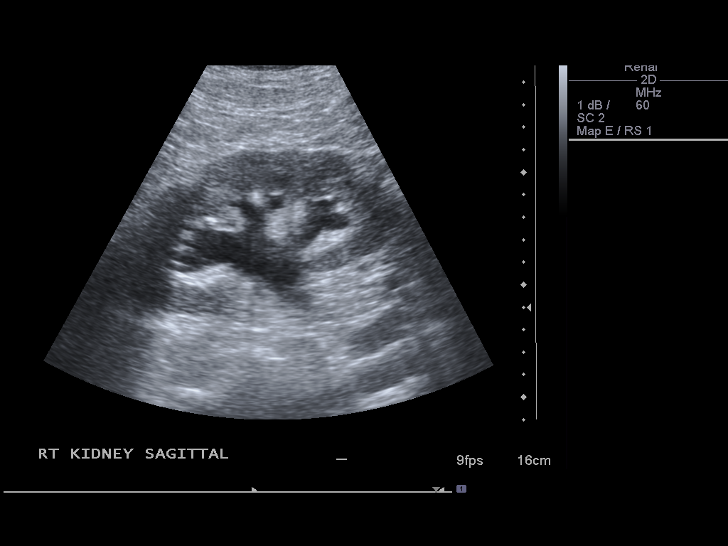
[im 54/59]
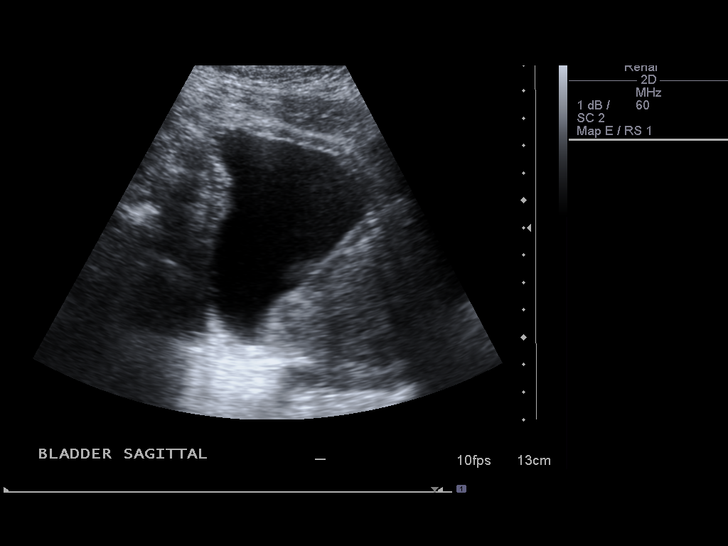
[im 59/59]
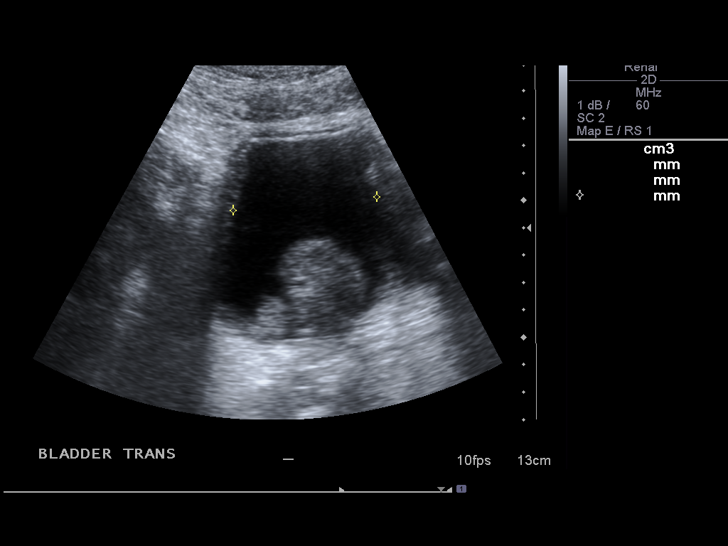

[13 of 25 positions shown; findings below may reference images not displayed]

FINDINGS: Right Kidney:  Moderate right hydronephrosis is stable from the
recent exam and mildly increased since Hug.  Right renal length
13.9 cm.  No focal renal lesion.

Left Kidney:  No hydronephrosis.  Renal length 10.6 cm.  Stable
cyst with septation in the upper pole measuring 28 x 25 x 14 mm.
No vascularity on color Doppler interrogation.

Bladder:  Prevoid volume calculated at 179 ml.  Postvoid volume 78
ml.  Water more shadowing echogenic stones are seen dependently in
the bladder. Left ureteral jet is demonstrated with Doppler.  No
right ureteral jet demonstrated.

The prostate is enlarged and projects up into the base of the
bladder.  Estimated prostate size 7.9 x 6.1 x 6.6 cm.
IMPRESSION: 1. Stable moderate right hydronephrosis which has increased since
Hug.  Absent right ureteral jet at the bladder suggests ureteral
obstructive etiology (such as ureteral calculus).
CT abdomen and pelvis without contrast would evaluate further.
2.  Stable, negative left kidney with simple upper pole cyst.
3.  Bladder stones.  Enlarged prostate.  Postvoid residual of 78
ml.

## 2012-09-12 DIAGNOSIS — E119 Type 2 diabetes mellitus without complications: Secondary | ICD-10-CM | POA: Diagnosis not present

## 2012-09-12 DIAGNOSIS — I10 Essential (primary) hypertension: Secondary | ICD-10-CM | POA: Diagnosis not present

## 2012-09-17 DIAGNOSIS — I10 Essential (primary) hypertension: Secondary | ICD-10-CM | POA: Diagnosis not present

## 2012-09-17 DIAGNOSIS — IMO0001 Reserved for inherently not codable concepts without codable children: Secondary | ICD-10-CM | POA: Diagnosis not present

## 2012-09-17 DIAGNOSIS — E7800 Pure hypercholesterolemia, unspecified: Secondary | ICD-10-CM | POA: Diagnosis not present

## 2012-09-19 DIAGNOSIS — IMO0001 Reserved for inherently not codable concepts without codable children: Secondary | ICD-10-CM | POA: Diagnosis not present

## 2012-10-22 DIAGNOSIS — N2581 Secondary hyperparathyroidism of renal origin: Secondary | ICD-10-CM | POA: Diagnosis not present

## 2012-10-22 DIAGNOSIS — N183 Chronic kidney disease, stage 3 unspecified: Secondary | ICD-10-CM | POA: Diagnosis not present

## 2012-10-23 DIAGNOSIS — E119 Type 2 diabetes mellitus without complications: Secondary | ICD-10-CM | POA: Diagnosis not present

## 2012-10-23 DIAGNOSIS — M2040 Other hammer toe(s) (acquired), unspecified foot: Secondary | ICD-10-CM | POA: Diagnosis not present

## 2012-10-23 DIAGNOSIS — L608 Other nail disorders: Secondary | ICD-10-CM | POA: Diagnosis not present

## 2012-10-23 DIAGNOSIS — M216X9 Other acquired deformities of unspecified foot: Secondary | ICD-10-CM | POA: Diagnosis not present

## 2012-10-25 DIAGNOSIS — N400 Enlarged prostate without lower urinary tract symptoms: Secondary | ICD-10-CM | POA: Diagnosis not present

## 2012-10-25 DIAGNOSIS — N529 Male erectile dysfunction, unspecified: Secondary | ICD-10-CM | POA: Diagnosis not present

## 2012-10-25 DIAGNOSIS — R972 Elevated prostate specific antigen [PSA]: Secondary | ICD-10-CM | POA: Diagnosis not present

## 2012-10-29 DIAGNOSIS — E119 Type 2 diabetes mellitus without complications: Secondary | ICD-10-CM | POA: Diagnosis not present

## 2012-10-29 DIAGNOSIS — I129 Hypertensive chronic kidney disease with stage 1 through stage 4 chronic kidney disease, or unspecified chronic kidney disease: Secondary | ICD-10-CM | POA: Diagnosis not present

## 2012-10-29 DIAGNOSIS — R972 Elevated prostate specific antigen [PSA]: Secondary | ICD-10-CM | POA: Diagnosis not present

## 2012-10-29 DIAGNOSIS — N183 Chronic kidney disease, stage 3 unspecified: Secondary | ICD-10-CM | POA: Diagnosis not present

## 2012-11-04 DIAGNOSIS — I10 Essential (primary) hypertension: Secondary | ICD-10-CM | POA: Diagnosis not present

## 2012-11-04 DIAGNOSIS — E7800 Pure hypercholesterolemia, unspecified: Secondary | ICD-10-CM | POA: Diagnosis not present

## 2012-11-04 DIAGNOSIS — IMO0001 Reserved for inherently not codable concepts without codable children: Secondary | ICD-10-CM | POA: Diagnosis not present

## 2012-11-04 DIAGNOSIS — N181 Chronic kidney disease, stage 1: Secondary | ICD-10-CM | POA: Diagnosis not present

## 2012-12-10 DIAGNOSIS — IMO0001 Reserved for inherently not codable concepts without codable children: Secondary | ICD-10-CM | POA: Diagnosis not present

## 2012-12-10 DIAGNOSIS — I10 Essential (primary) hypertension: Secondary | ICD-10-CM | POA: Diagnosis not present

## 2012-12-11 DIAGNOSIS — N400 Enlarged prostate without lower urinary tract symptoms: Secondary | ICD-10-CM | POA: Diagnosis not present

## 2012-12-11 DIAGNOSIS — R972 Elevated prostate specific antigen [PSA]: Secondary | ICD-10-CM | POA: Diagnosis not present

## 2013-01-24 DIAGNOSIS — E1149 Type 2 diabetes mellitus with other diabetic neurological complication: Secondary | ICD-10-CM | POA: Diagnosis not present

## 2013-01-24 DIAGNOSIS — M216X9 Other acquired deformities of unspecified foot: Secondary | ICD-10-CM | POA: Diagnosis not present

## 2013-01-24 DIAGNOSIS — L84 Corns and callosities: Secondary | ICD-10-CM | POA: Diagnosis not present

## 2013-01-24 DIAGNOSIS — L608 Other nail disorders: Secondary | ICD-10-CM | POA: Diagnosis not present

## 2013-01-28 DIAGNOSIS — D3130 Benign neoplasm of unspecified choroid: Secondary | ICD-10-CM | POA: Diagnosis not present

## 2013-01-28 DIAGNOSIS — H2510 Age-related nuclear cataract, unspecified eye: Secondary | ICD-10-CM | POA: Diagnosis not present

## 2013-01-28 DIAGNOSIS — H43819 Vitreous degeneration, unspecified eye: Secondary | ICD-10-CM | POA: Diagnosis not present

## 2013-01-28 DIAGNOSIS — E119 Type 2 diabetes mellitus without complications: Secondary | ICD-10-CM | POA: Diagnosis not present

## 2013-03-13 DIAGNOSIS — E119 Type 2 diabetes mellitus without complications: Secondary | ICD-10-CM | POA: Diagnosis not present

## 2013-03-13 DIAGNOSIS — E7800 Pure hypercholesterolemia, unspecified: Secondary | ICD-10-CM | POA: Diagnosis not present

## 2013-03-13 DIAGNOSIS — Z79899 Other long term (current) drug therapy: Secondary | ICD-10-CM | POA: Diagnosis not present

## 2013-03-13 DIAGNOSIS — W311XXA Contact with metalworking machines, initial encounter: Secondary | ICD-10-CM | POA: Diagnosis not present

## 2013-03-13 DIAGNOSIS — Z7982 Long term (current) use of aspirin: Secondary | ICD-10-CM | POA: Diagnosis not present

## 2013-03-13 DIAGNOSIS — I10 Essential (primary) hypertension: Secondary | ICD-10-CM | POA: Diagnosis not present

## 2013-03-13 DIAGNOSIS — S51809A Unspecified open wound of unspecified forearm, initial encounter: Secondary | ICD-10-CM | POA: Diagnosis not present

## 2013-03-13 DIAGNOSIS — Z23 Encounter for immunization: Secondary | ICD-10-CM | POA: Diagnosis not present

## 2013-05-07 DIAGNOSIS — L84 Corns and callosities: Secondary | ICD-10-CM | POA: Diagnosis not present

## 2013-05-07 DIAGNOSIS — E1149 Type 2 diabetes mellitus with other diabetic neurological complication: Secondary | ICD-10-CM | POA: Diagnosis not present

## 2013-05-07 DIAGNOSIS — L608 Other nail disorders: Secondary | ICD-10-CM | POA: Diagnosis not present

## 2013-05-12 DIAGNOSIS — E119 Type 2 diabetes mellitus without complications: Secondary | ICD-10-CM | POA: Diagnosis not present

## 2013-05-16 DIAGNOSIS — IMO0001 Reserved for inherently not codable concepts without codable children: Secondary | ICD-10-CM | POA: Diagnosis not present

## 2013-05-16 DIAGNOSIS — I10 Essential (primary) hypertension: Secondary | ICD-10-CM | POA: Diagnosis not present

## 2013-05-16 DIAGNOSIS — N400 Enlarged prostate without lower urinary tract symptoms: Secondary | ICD-10-CM | POA: Diagnosis not present

## 2013-05-16 DIAGNOSIS — E7800 Pure hypercholesterolemia, unspecified: Secondary | ICD-10-CM | POA: Diagnosis not present

## 2013-07-05 DIAGNOSIS — Z23 Encounter for immunization: Secondary | ICD-10-CM | POA: Diagnosis not present

## 2013-07-15 DIAGNOSIS — L608 Other nail disorders: Secondary | ICD-10-CM | POA: Diagnosis not present

## 2013-07-15 DIAGNOSIS — E1149 Type 2 diabetes mellitus with other diabetic neurological complication: Secondary | ICD-10-CM | POA: Diagnosis not present

## 2013-07-15 DIAGNOSIS — L84 Corns and callosities: Secondary | ICD-10-CM | POA: Diagnosis not present

## 2013-10-07 DIAGNOSIS — IMO0001 Reserved for inherently not codable concepts without codable children: Secondary | ICD-10-CM | POA: Diagnosis not present

## 2013-10-07 DIAGNOSIS — I10 Essential (primary) hypertension: Secondary | ICD-10-CM | POA: Diagnosis not present

## 2013-10-13 DIAGNOSIS — I10 Essential (primary) hypertension: Secondary | ICD-10-CM | POA: Diagnosis not present

## 2013-10-13 DIAGNOSIS — IMO0001 Reserved for inherently not codable concepts without codable children: Secondary | ICD-10-CM | POA: Diagnosis not present

## 2013-10-13 DIAGNOSIS — E7800 Pure hypercholesterolemia, unspecified: Secondary | ICD-10-CM | POA: Diagnosis not present

## 2013-10-29 DIAGNOSIS — R972 Elevated prostate specific antigen [PSA]: Secondary | ICD-10-CM | POA: Diagnosis not present

## 2013-10-29 DIAGNOSIS — N400 Enlarged prostate without lower urinary tract symptoms: Secondary | ICD-10-CM | POA: Diagnosis not present

## 2013-11-05 DIAGNOSIS — H2510 Age-related nuclear cataract, unspecified eye: Secondary | ICD-10-CM | POA: Diagnosis not present

## 2013-11-05 DIAGNOSIS — H35419 Lattice degeneration of retina, unspecified eye: Secondary | ICD-10-CM | POA: Diagnosis not present

## 2013-11-05 DIAGNOSIS — H43819 Vitreous degeneration, unspecified eye: Secondary | ICD-10-CM | POA: Diagnosis not present

## 2013-11-05 DIAGNOSIS — E119 Type 2 diabetes mellitus without complications: Secondary | ICD-10-CM | POA: Diagnosis not present

## 2013-11-05 DIAGNOSIS — D3130 Benign neoplasm of unspecified choroid: Secondary | ICD-10-CM | POA: Diagnosis not present

## 2014-03-24 DIAGNOSIS — I10 Essential (primary) hypertension: Secondary | ICD-10-CM | POA: Diagnosis not present

## 2014-03-24 DIAGNOSIS — S61509A Unspecified open wound of unspecified wrist, initial encounter: Secondary | ICD-10-CM | POA: Diagnosis not present

## 2014-03-24 DIAGNOSIS — M25539 Pain in unspecified wrist: Secondary | ICD-10-CM | POA: Diagnosis not present

## 2014-03-24 DIAGNOSIS — E119 Type 2 diabetes mellitus without complications: Secondary | ICD-10-CM | POA: Diagnosis not present

## 2014-04-18 DIAGNOSIS — H25019 Cortical age-related cataract, unspecified eye: Secondary | ICD-10-CM | POA: Diagnosis not present

## 2014-04-18 DIAGNOSIS — H40019 Open angle with borderline findings, low risk, unspecified eye: Secondary | ICD-10-CM | POA: Diagnosis not present

## 2014-04-18 DIAGNOSIS — E119 Type 2 diabetes mellitus without complications: Secondary | ICD-10-CM | POA: Diagnosis not present

## 2014-04-18 DIAGNOSIS — H2510 Age-related nuclear cataract, unspecified eye: Secondary | ICD-10-CM | POA: Diagnosis not present

## 2014-05-02 DIAGNOSIS — R300 Dysuria: Secondary | ICD-10-CM | POA: Diagnosis not present

## 2014-05-02 DIAGNOSIS — N390 Urinary tract infection, site not specified: Secondary | ICD-10-CM | POA: Diagnosis not present

## 2014-07-06 DIAGNOSIS — Z23 Encounter for immunization: Secondary | ICD-10-CM | POA: Diagnosis not present

## 2014-07-07 DIAGNOSIS — E1165 Type 2 diabetes mellitus with hyperglycemia: Secondary | ICD-10-CM | POA: Diagnosis not present

## 2014-07-07 DIAGNOSIS — I10 Essential (primary) hypertension: Secondary | ICD-10-CM | POA: Diagnosis not present

## 2014-07-15 DIAGNOSIS — E780 Pure hypercholesterolemia: Secondary | ICD-10-CM | POA: Diagnosis not present

## 2014-07-15 DIAGNOSIS — I10 Essential (primary) hypertension: Secondary | ICD-10-CM | POA: Diagnosis not present

## 2014-07-15 DIAGNOSIS — E1165 Type 2 diabetes mellitus with hyperglycemia: Secondary | ICD-10-CM | POA: Diagnosis not present

## 2014-08-10 DIAGNOSIS — Z09 Encounter for follow-up examination after completed treatment for conditions other than malignant neoplasm: Secondary | ICD-10-CM | POA: Diagnosis not present

## 2014-08-10 DIAGNOSIS — Z86010 Personal history of colonic polyps: Secondary | ICD-10-CM | POA: Diagnosis not present

## 2014-08-10 DIAGNOSIS — Z800 Family history of malignant neoplasm of digestive organs: Secondary | ICD-10-CM | POA: Diagnosis not present

## 2015-04-29 DIAGNOSIS — D3132 Benign neoplasm of left choroid: Secondary | ICD-10-CM | POA: Diagnosis not present

## 2015-04-29 DIAGNOSIS — H40013 Open angle with borderline findings, low risk, bilateral: Secondary | ICD-10-CM | POA: Diagnosis not present

## 2015-04-29 DIAGNOSIS — E119 Type 2 diabetes mellitus without complications: Secondary | ICD-10-CM | POA: Diagnosis not present

## 2015-04-29 DIAGNOSIS — H2513 Age-related nuclear cataract, bilateral: Secondary | ICD-10-CM | POA: Diagnosis not present

## 2015-04-29 DIAGNOSIS — H25013 Cortical age-related cataract, bilateral: Secondary | ICD-10-CM | POA: Diagnosis not present

## 2015-07-17 DIAGNOSIS — Z23 Encounter for immunization: Secondary | ICD-10-CM | POA: Diagnosis not present

## 2015-07-19 DIAGNOSIS — Z23 Encounter for immunization: Secondary | ICD-10-CM | POA: Diagnosis not present

## 2015-10-06 DIAGNOSIS — I10 Essential (primary) hypertension: Secondary | ICD-10-CM | POA: Diagnosis not present

## 2015-10-06 DIAGNOSIS — E7800 Pure hypercholesterolemia, unspecified: Secondary | ICD-10-CM | POA: Diagnosis not present

## 2015-10-06 DIAGNOSIS — Z0000 Encounter for general adult medical examination without abnormal findings: Secondary | ICD-10-CM | POA: Diagnosis not present

## 2015-10-06 DIAGNOSIS — Z125 Encounter for screening for malignant neoplasm of prostate: Secondary | ICD-10-CM | POA: Diagnosis not present

## 2015-10-13 DIAGNOSIS — E7800 Pure hypercholesterolemia, unspecified: Secondary | ICD-10-CM | POA: Diagnosis not present

## 2015-10-13 DIAGNOSIS — I10 Essential (primary) hypertension: Secondary | ICD-10-CM | POA: Diagnosis not present

## 2015-10-13 DIAGNOSIS — E1165 Type 2 diabetes mellitus with hyperglycemia: Secondary | ICD-10-CM | POA: Diagnosis not present

## 2015-12-01 DIAGNOSIS — N401 Enlarged prostate with lower urinary tract symptoms: Secondary | ICD-10-CM | POA: Diagnosis not present

## 2015-12-01 DIAGNOSIS — N5201 Erectile dysfunction due to arterial insufficiency: Secondary | ICD-10-CM | POA: Diagnosis not present

## 2015-12-01 DIAGNOSIS — N390 Urinary tract infection, site not specified: Secondary | ICD-10-CM | POA: Diagnosis not present

## 2015-12-01 DIAGNOSIS — Z0000 Encounter for general adult medical examination without abnormal findings: Secondary | ICD-10-CM | POA: Diagnosis not present

## 2015-12-01 DIAGNOSIS — R310 Gross hematuria: Secondary | ICD-10-CM | POA: Diagnosis not present

## 2015-12-01 DIAGNOSIS — R972 Elevated prostate specific antigen [PSA]: Secondary | ICD-10-CM | POA: Diagnosis not present

## 2015-12-01 DIAGNOSIS — N138 Other obstructive and reflux uropathy: Secondary | ICD-10-CM | POA: Diagnosis not present

## 2015-12-06 DIAGNOSIS — N210 Calculus in bladder: Secondary | ICD-10-CM | POA: Diagnosis not present

## 2015-12-13 DIAGNOSIS — N200 Calculus of kidney: Secondary | ICD-10-CM | POA: Diagnosis not present

## 2015-12-13 DIAGNOSIS — R310 Gross hematuria: Secondary | ICD-10-CM | POA: Diagnosis not present

## 2015-12-30 ENCOUNTER — Other Ambulatory Visit: Payer: Self-pay | Admitting: Urology

## 2015-12-31 ENCOUNTER — Other Ambulatory Visit: Payer: Self-pay | Admitting: Urology

## 2016-01-05 ENCOUNTER — Encounter (HOSPITAL_COMMUNITY)
Admission: RE | Admit: 2016-01-05 | Discharge: 2016-01-05 | Disposition: A | Discharge status: Home / Self Care | Payer: Medicare Other | Source: Ambulatory Visit | Attending: Urology | Admitting: Urology

## 2016-01-05 ENCOUNTER — Encounter (HOSPITAL_COMMUNITY): Payer: Self-pay

## 2016-01-05 DIAGNOSIS — N210 Calculus in bladder: Secondary | ICD-10-CM | POA: Diagnosis not present

## 2016-01-05 DIAGNOSIS — I10 Essential (primary) hypertension: Secondary | ICD-10-CM | POA: Insufficient documentation

## 2016-01-05 DIAGNOSIS — N401 Enlarged prostate with lower urinary tract symptoms: Secondary | ICD-10-CM | POA: Diagnosis not present

## 2016-01-05 DIAGNOSIS — Z85828 Personal history of other malignant neoplasm of skin: Secondary | ICD-10-CM | POA: Insufficient documentation

## 2016-01-05 DIAGNOSIS — Z7984 Long term (current) use of oral hypoglycemic drugs: Secondary | ICD-10-CM | POA: Insufficient documentation

## 2016-01-05 DIAGNOSIS — Z87442 Personal history of urinary calculi: Secondary | ICD-10-CM | POA: Insufficient documentation

## 2016-01-05 DIAGNOSIS — Z7982 Long term (current) use of aspirin: Secondary | ICD-10-CM | POA: Insufficient documentation

## 2016-01-05 DIAGNOSIS — N138 Other obstructive and reflux uropathy: Secondary | ICD-10-CM | POA: Insufficient documentation

## 2016-01-05 DIAGNOSIS — Z79899 Other long term (current) drug therapy: Secondary | ICD-10-CM | POA: Insufficient documentation

## 2016-01-05 DIAGNOSIS — D62 Acute posthemorrhagic anemia: Secondary | ICD-10-CM | POA: Insufficient documentation

## 2016-01-05 DIAGNOSIS — C61 Malignant neoplasm of prostate: Principal | ICD-10-CM | POA: Insufficient documentation

## 2016-01-05 DIAGNOSIS — E119 Type 2 diabetes mellitus without complications: Secondary | ICD-10-CM | POA: Insufficient documentation

## 2016-01-05 DIAGNOSIS — E785 Hyperlipidemia, unspecified: Secondary | ICD-10-CM | POA: Insufficient documentation

## 2016-01-05 DIAGNOSIS — Z87440 Personal history of urinary (tract) infections: Secondary | ICD-10-CM | POA: Insufficient documentation

## 2016-01-05 HISTORY — DX: Personal history of other malignant neoplasm of skin: Z85.828

## 2016-01-05 HISTORY — DX: Hyperlipidemia, unspecified: E78.5

## 2016-01-05 HISTORY — DX: Type 2 diabetes mellitus without complications: E11.9

## 2016-01-05 HISTORY — DX: Benign prostatic hyperplasia without lower urinary tract symptoms: N40.0

## 2016-01-05 HISTORY — DX: Malignant (primary) neoplasm, unspecified: C80.1

## 2016-01-05 HISTORY — DX: Personal history of urinary calculi: Z87.442

## 2016-01-05 HISTORY — DX: Personal history of other diseases of urinary system: Z87.448

## 2016-01-05 HISTORY — DX: Other specified abnormal findings of blood chemistry: R79.89

## 2016-01-05 HISTORY — DX: Essential (primary) hypertension: I10

## 2016-01-05 HISTORY — DX: Personal history of urinary (tract) infections: Z87.440

## 2016-01-05 LAB — CBC
HEMATOCRIT: 39.7 % (ref 39.0–52.0)
HEMOGLOBIN: 13 g/dL (ref 13.0–17.0)
MCH: 31.1 pg (ref 26.0–34.0)
MCHC: 32.7 g/dL (ref 30.0–36.0)
MCV: 95 fL (ref 78.0–100.0)
Platelets: 137 10*3/uL — ABNORMAL LOW (ref 150–400)
RBC: 4.18 MIL/uL — ABNORMAL LOW (ref 4.22–5.81)
RDW: 13.7 % (ref 11.5–15.5)
WBC: 3.9 10*3/uL — ABNORMAL LOW (ref 4.0–10.5)

## 2016-01-05 LAB — BASIC METABOLIC PANEL
ANION GAP: 9 (ref 5–15)
BUN: 31 mg/dL — ABNORMAL HIGH (ref 6–20)
CALCIUM: 9.6 mg/dL (ref 8.9–10.3)
CHLORIDE: 110 mmol/L (ref 101–111)
CO2: 24 mmol/L (ref 22–32)
Creatinine, Ser: 1.27 mg/dL — ABNORMAL HIGH (ref 0.61–1.24)
GFR calc Af Amer: >60 mL/min (ref >60)
GFR calc non Af Amer: 55 mL/min — ABNORMAL LOW (ref >60)
GLUCOSE: 127 mg/dL — AB (ref 65–99)
POTASSIUM: 4.4 mmol/L (ref 3.5–5.1)
Sodium: 143 mmol/L (ref 135–145)

## 2016-01-05 NOTE — Patient Instructions (Addendum)
YOUR PROCEDURE IS SCHEDULED ON : 01/06/16  REPORT TO [facility] MAIN ENTRANCE FOLLOW SIGNS TO EAST ELEVATOR - GO TO 3rd FLOOR CHECK IN AT 3 EAST NURSES STATION (SHORT STAY) AT: 10:30 AM  CALL THIS NUMBER IF YOU HAVE PROBLEMS THE MORNING OF SURGERY [phone]  REMEMBER:ONLY 1 PER PERSON MAY GO TO SHORT STAY WITH YOU TO GET READY THE MORNING OF YOUR SURGERY  DO NOT EAT FOOD OR DRINK LIQUIDS AFTER MIDNIGHT  MAY HAVE WATER UNTIL 6:30 AM  TAKE THESE MEDICINES THE MORNING OF SURGERY: CARVEDILOL / AMLODIPINE  YOU MAY NOT HAVE ANY METAL ON YOUR BODY INCLUDING HAIR PINS AND PIERCING'S. DO NOT WEAR JEWELRY, MAKEUP, LOTIONS, POWDERS OR PERFUMES. DO NOT WEAR NAIL POLISH. DO NOT SHAVE 48 HRS PRIOR TO SURGERY. MEN MAY SHAVE FACE AND NECK.  DO NOT [place]. [facility] IS NOT RESPONSIBLE FOR VALUABLES.  CONTACTS, DENTURES OR PARTIALS MAY NOT BE WORN TO SURGERY. LEAVE SUITCASE IN CAR. CAN BE BROUGHT TO ROOM AFTER SURGERY.  PATIENTS DISCHARGED THE DAY OF SURGERY WILL NOT BE ALLOWED TO DRIVE HOME.  PLEASE READ OVER THE FOLLOWING INSTRUCTION SHEETS _________________________________________________________________________________                                          [place] - PREPARING FOR SURGERY  Before surgery, you can play an important role.  Because skin is not sterile, your skin needs to be as free of germs as possible.  You can reduce the number of germs on your skin by washing with CHG (chlorahexidine gluconate) soap before surgery.  CHG is an antiseptic cleaner which kills germs and bonds with the skin to continue killing germs even after washing. Please DO NOT use if you have an allergy to CHG or antibacterial soaps.  If your skin becomes reddened/irritated stop using the CHG and inform your nurse when you arrive at Short Stay. Do not shave (including legs and underarms) for at least 48 hours prior to the first CHG shower.  You may shave your  face. Please follow these instructions carefully:   1.  Shower with CHG Soap the night before surgery and the  morning of Surgery.   2.  If you choose to wash your hair, wash your hair first as usual with your  normal  Shampoo.   3.  After you shampoo, rinse your hair and body thoroughly to remove the  shampoo.                                         4.  Use CHG as you would any other liquid soap.  You can apply chg directly  to the skin and wash . Gently wash with scrungie or clean wascloth    5.  Apply the CHG Soap to your body ONLY FROM THE NECK DOWN.   Do not use on open                           Wound or open sores. Avoid contact with eyes, ears mouth and genitals (private parts).                        Genitals (private  parts) with your normal soap.              6.  Wash thoroughly, paying special attention to the area where your surgery  will be performed.   7.  Thoroughly rinse your body with warm water from the neck down.   8.  DO NOT shower/wash with your normal soap after using and rinsing off  the CHG Soap .                9.  Pat yourself dry with a clean towel.             10.  Wear clean night clothes to bed after shower             11.  Place clean sheets on your bed the night of your first shower and do not  sleep with pets.  Day of Surgery : Do not apply any lotions/deodorants the morning of surgery.  Please wear clean clothes to the hospital/surgery center.  FAILURE TO FOLLOW THESE INSTRUCTIONS MAY RESULT IN THE CANCELLATION OF YOUR SURGERY    PATIENT SIGNATURE_________________________________  ______________________________________________________________________

## 2016-01-06 ENCOUNTER — Ambulatory Visit (HOSPITAL_COMMUNITY): Payer: Medicare Other | Admitting: Certified Registered Nurse Anesthetist

## 2016-01-06 ENCOUNTER — Encounter (HOSPITAL_COMMUNITY): Payer: Self-pay | Admitting: *Deleted

## 2016-01-06 ENCOUNTER — Observation Stay (HOSPITAL_COMMUNITY)
Admission: RE | Admit: 2016-01-06 | Discharge: 2016-01-08 | Disposition: A | Discharge status: Home / Self Care | Payer: Medicare Other | Source: Ambulatory Visit | Attending: Urology | Admitting: Urology

## 2016-01-06 ENCOUNTER — Encounter (HOSPITAL_COMMUNITY)
Admission: RE | Disposition: A | Discharge status: Home / Self Care | Payer: Self-pay | Source: Ambulatory Visit | Attending: Urology

## 2016-01-06 DIAGNOSIS — N179 Acute kidney failure, unspecified: Secondary | ICD-10-CM | POA: Diagnosis not present

## 2016-01-06 DIAGNOSIS — N183 Chronic kidney disease, stage 3 (moderate): Secondary | ICD-10-CM | POA: Diagnosis not present

## 2016-01-06 DIAGNOSIS — N138 Other obstructive and reflux uropathy: Secondary | ICD-10-CM

## 2016-01-06 DIAGNOSIS — N390 Urinary tract infection, site not specified: Secondary | ICD-10-CM | POA: Diagnosis not present

## 2016-01-06 DIAGNOSIS — A4181 Sepsis due to Enterococcus: Secondary | ICD-10-CM | POA: Diagnosis not present

## 2016-01-06 DIAGNOSIS — N210 Calculus in bladder: Secondary | ICD-10-CM | POA: Diagnosis not present

## 2016-01-06 DIAGNOSIS — N400 Enlarged prostate without lower urinary tract symptoms: Secondary | ICD-10-CM | POA: Diagnosis not present

## 2016-01-06 DIAGNOSIS — N401 Enlarged prostate with lower urinary tract symptoms: Secondary | ICD-10-CM | POA: Diagnosis not present

## 2016-01-06 DIAGNOSIS — D62 Acute posthemorrhagic anemia: Secondary | ICD-10-CM | POA: Diagnosis not present

## 2016-01-06 DIAGNOSIS — I129 Hypertensive chronic kidney disease with stage 1 through stage 4 chronic kidney disease, or unspecified chronic kidney disease: Secondary | ICD-10-CM | POA: Diagnosis not present

## 2016-01-06 DIAGNOSIS — C61 Malignant neoplasm of prostate: Secondary | ICD-10-CM | POA: Diagnosis not present

## 2016-01-06 DIAGNOSIS — E860 Dehydration: Secondary | ICD-10-CM | POA: Diagnosis not present

## 2016-01-06 DIAGNOSIS — D696 Thrombocytopenia, unspecified: Secondary | ICD-10-CM | POA: Diagnosis not present

## 2016-01-06 DIAGNOSIS — E785 Hyperlipidemia, unspecified: Secondary | ICD-10-CM | POA: Diagnosis not present

## 2016-01-06 DIAGNOSIS — E872 Acidosis: Secondary | ICD-10-CM | POA: Diagnosis not present

## 2016-01-06 DIAGNOSIS — E1122 Type 2 diabetes mellitus with diabetic chronic kidney disease: Secondary | ICD-10-CM | POA: Diagnosis not present

## 2016-01-06 HISTORY — PX: TRANSURETHRAL RESECTION OF PROSTATE: SHX73

## 2016-01-06 HISTORY — PX: CYSTOSCOPY WITH LITHOLAPAXY: SHX1425

## 2016-01-06 LAB — HEMOGLOBIN A1C
Hgb A1c MFr Bld: 7 % — ABNORMAL HIGH (ref 4.8–5.6)
Mean Plasma Glucose: 154 mg/dL

## 2016-01-06 LAB — GLUCOSE, CAPILLARY
GLUCOSE-CAPILLARY: 84 mg/dL (ref 65–99)
GLUCOSE-CAPILLARY: 243 mg/dL — AB (ref 65–99)
GLUCOSE-CAPILLARY: 280 mg/dL — AB (ref 65–99)
Glucose-Capillary: 112 mg/dL — ABNORMAL HIGH (ref 65–99)

## 2016-01-06 LAB — URINE MICROSCOPIC-ADD ON

## 2016-01-06 LAB — URINALYSIS, ROUTINE W REFLEX MICROSCOPIC
Bilirubin Urine: NEGATIVE
GLUCOSE, UA: NEGATIVE mg/dL
Hgb urine dipstick: NEGATIVE
Ketones, ur: NEGATIVE mg/dL
Nitrite: NEGATIVE
PH: 6 (ref 5.0–8.0)
PROTEIN: NEGATIVE mg/dL
SPECIFIC GRAVITY, URINE: 1.018 (ref 1.005–1.030)

## 2016-01-06 LAB — BASIC METABOLIC PANEL
ANION GAP: 7 (ref 5–15)
BUN: 31 mg/dL — AB (ref 6–20)
CHLORIDE: 108 mmol/L (ref 101–111)
CO2: 23 mmol/L (ref 22–32)
Calcium: 8.4 mg/dL — ABNORMAL LOW (ref 8.9–10.3)
Creatinine, Ser: 1.5 mg/dL — ABNORMAL HIGH (ref 0.61–1.24)
GFR calc Af Amer: 52 mL/min — ABNORMAL LOW (ref >60)
GFR, EST NON AFRICAN AMERICAN: 45 mL/min — AB (ref >60)
GLUCOSE: 193 mg/dL — AB (ref 65–99)
POTASSIUM: 5.2 mmol/L — AB (ref 3.5–5.1)
Sodium: 138 mmol/L (ref 135–145)

## 2016-01-06 LAB — HEMOGLOBIN AND HEMATOCRIT, BLOOD
HCT: 24.5 % — ABNORMAL LOW (ref 39.0–52.0)
Hemoglobin: 8.5 g/dL — ABNORMAL LOW (ref 13.0–17.0)

## 2016-01-06 SURGERY — TRANSURETHRAL RESECTION OF THE PROSTATE WITH GYRUS INSTRUMENTS
Anesthesia: General | Site: Prostate

## 2016-01-06 MED ORDER — CEFAZOLIN SODIUM-DEXTROSE 2-4 GM/100ML-% IV SOLN
2.0000 g | INTRAVENOUS | Status: AC
  Administered 2016-01-06: 2 g via INTRAVENOUS
  Filled 2016-01-06: qty 100

## 2016-01-06 MED ORDER — DOCUSATE SODIUM 100 MG PO CAPS
100.0000 mg | ORAL_CAPSULE | Freq: Two times a day (BID) | ORAL | Status: DC
  Administered 2016-01-06 – 2016-01-07 (×3): 100 mg via ORAL
  Filled 2016-01-06 (×4): qty 1

## 2016-01-06 MED ORDER — LIDOCAINE HCL (CARDIAC) 20 MG/ML IV SOLN
INTRAVENOUS | Status: AC
  Filled 2016-01-06: qty 5

## 2016-01-06 MED ORDER — PROPOFOL 10 MG/ML IV BOLUS
INTRAVENOUS | Status: DC | PRN
  Administered 2016-01-06: 200 mg via INTRAVENOUS

## 2016-01-06 MED ORDER — ACETAMINOPHEN 325 MG PO TABS: 650.0000 mg | ORAL_TABLET | ORAL | Status: DC | PRN

## 2016-01-06 MED ORDER — HYDROCODONE-ACETAMINOPHEN 5-325 MG PO TABS
1.0000 | ORAL_TABLET | ORAL | Status: DC | PRN
  Administered 2016-01-06: 1 via ORAL
  Filled 2016-01-06: qty 1

## 2016-01-06 MED ORDER — SODIUM CHLORIDE 0.9 % IJ SOLN
INTRAMUSCULAR | Status: AC
  Filled 2016-01-06: qty 10

## 2016-01-06 MED ORDER — ETOMIDATE 2 MG/ML IV SOLN
INTRAVENOUS | Status: AC
  Filled 2016-01-06: qty 10

## 2016-01-06 MED ORDER — LIDOCAINE HCL (CARDIAC) 20 MG/ML IV SOLN
INTRAVENOUS | Status: DC | PRN
  Administered 2016-01-06: 100 mg via INTRAVENOUS

## 2016-01-06 MED ORDER — SODIUM CHLORIDE 0.45 % IV SOLN
INTRAVENOUS | Status: DC
  Administered 2016-01-06 – 2016-01-08 (×4): via INTRAVENOUS

## 2016-01-06 MED ORDER — SODIUM CHLORIDE 0.9 % IR SOLN
3000.0000 mL | Status: DC
  Administered 2016-01-06 (×2): 3000 mL

## 2016-01-06 MED ORDER — BELLADONNA ALKALOIDS-OPIUM 16.2-60 MG RE SUPP
1.0000 | Freq: Four times a day (QID) | RECTAL | Status: DC | PRN
  Filled 2016-01-06: qty 1

## 2016-01-06 MED ORDER — LACTATED RINGERS IV SOLN
INTRAVENOUS | Status: DC
  Administered 2016-01-06 (×2): via INTRAVENOUS

## 2016-01-06 MED ORDER — SULFAMETHOXAZOLE-TRIMETHOPRIM 800-160 MG PO TABS: 1.0000 | ORAL_TABLET | Freq: Two times a day (BID) | ORAL | Status: DC

## 2016-01-06 MED ORDER — IOPAMIDOL (ISOVUE-300) INJECTION 61%
INTRAVENOUS | Status: AC
  Filled 2016-01-06: qty 50

## 2016-01-06 MED ORDER — EPHEDRINE SULFATE 50 MG/ML IJ SOLN
INTRAMUSCULAR | Status: DC | PRN
  Administered 2016-01-06 (×4): 10 mg via INTRAVENOUS
  Administered 2016-01-06 (×2): 5 mg via INTRAVENOUS

## 2016-01-06 MED ORDER — PROPOFOL 10 MG/ML IV BOLUS
INTRAVENOUS | Status: AC
  Filled 2016-01-06: qty 20

## 2016-01-06 MED ORDER — LACTATED RINGERS IV SOLN: INTRAVENOUS | Status: DC

## 2016-01-06 MED ORDER — STERILE WATER FOR IRRIGATION IR SOLN
Status: DC | PRN
  Administered 2016-01-06: 54000 mL

## 2016-01-06 MED ORDER — FENTANYL CITRATE (PF) 100 MCG/2ML IJ SOLN
INTRAMUSCULAR | Status: DC | PRN
  Administered 2016-01-06 (×2): 50 ug via INTRAVENOUS

## 2016-01-06 MED ORDER — FENTANYL CITRATE (PF) 100 MCG/2ML IJ SOLN: 25.0000 ug | INTRAMUSCULAR | Status: DC | PRN

## 2016-01-06 MED ORDER — METFORMIN HCL 500 MG PO TABS
500.0000 mg | ORAL_TABLET | Freq: Every day | ORAL | Status: DC
  Filled 2016-01-06: qty 1

## 2016-01-06 MED ORDER — MEPERIDINE HCL 50 MG/ML IJ SOLN: 6.2500 mg | INTRAMUSCULAR | Status: DC | PRN

## 2016-01-06 MED ORDER — CARVEDILOL 3.125 MG PO TABS
3.1250 mg | ORAL_TABLET | Freq: Two times a day (BID) | ORAL | Status: DC
  Administered 2016-01-06 – 2016-01-07 (×3): 3.125 mg via ORAL
  Filled 2016-01-06 (×3): qty 1

## 2016-01-06 MED ORDER — SODIUM CHLORIDE 0.9 % IV BOLUS (SEPSIS)
1000.0000 mL | Freq: Once | INTRAVENOUS | Status: AC
  Administered 2016-01-06: 1000 mL via INTRAVENOUS

## 2016-01-06 MED ORDER — CEFAZOLIN SODIUM-DEXTROSE 2-4 GM/100ML-% IV SOLN
INTRAVENOUS | Status: AC
  Filled 2016-01-06: qty 100

## 2016-01-06 MED ORDER — PIOGLITAZONE HCL 30 MG PO TABS
30.0000 mg | ORAL_TABLET | Freq: Every day | ORAL | Status: DC
  Administered 2016-01-06 – 2016-01-07 (×2): 30 mg via ORAL
  Filled 2016-01-06 (×2): qty 1

## 2016-01-06 MED ORDER — ONDANSETRON HCL 4 MG/2ML IJ SOLN
INTRAMUSCULAR | Status: AC
  Filled 2016-01-06: qty 2

## 2016-01-06 MED ORDER — BELLADONNA ALKALOIDS-OPIUM 16.2-60 MG RE SUPP
RECTAL | Status: AC
  Filled 2016-01-06: qty 1

## 2016-01-06 MED ORDER — METOCLOPRAMIDE HCL 5 MG/ML IJ SOLN: 10.0000 mg | Freq: Once | INTRAMUSCULAR | Status: DC | PRN

## 2016-01-06 MED ORDER — BELLADONNA ALKALOIDS-OPIUM 16.2-60 MG RE SUPP
RECTAL | Status: DC | PRN
  Administered 2016-01-06: 1 via RECTAL

## 2016-01-06 MED ORDER — FENTANYL CITRATE (PF) 100 MCG/2ML IJ SOLN
INTRAMUSCULAR | Status: AC
  Filled 2016-01-06: qty 2

## 2016-01-06 MED ORDER — GLIMEPIRIDE 4 MG PO TABS
4.0000 mg | ORAL_TABLET | Freq: Two times a day (BID) | ORAL | Status: DC
  Administered 2016-01-06 – 2016-01-07 (×3): 4 mg via ORAL
  Filled 2016-01-06 (×4): qty 1

## 2016-01-06 MED ORDER — ONDANSETRON HCL 4 MG/2ML IJ SOLN
INTRAMUSCULAR | Status: DC | PRN
  Administered 2016-01-06: 4 mg via INTRAVENOUS

## 2016-01-06 MED ORDER — AMLODIPINE BESYLATE 5 MG PO TABS
5.0000 mg | ORAL_TABLET | Freq: Every morning | ORAL | Status: DC
Start: 2016-01-07 — End: 2016-01-08
  Administered 2016-01-07: 5 mg via ORAL
  Filled 2016-01-06 (×2): qty 1

## 2016-01-06 MED ORDER — SIMVASTATIN 20 MG PO TABS
20.0000 mg | ORAL_TABLET | Freq: Every day | ORAL | Status: DC
  Administered 2016-01-06 – 2016-01-07 (×2): 20 mg via ORAL
  Filled 2016-01-06 (×2): qty 1

## 2016-01-06 MED ORDER — PHENYLEPHRINE HCL 10 MG/ML IJ SOLN
INTRAMUSCULAR | Status: DC | PRN
  Administered 2016-01-06: 80 ug via INTRAVENOUS
  Administered 2016-01-06 (×2): 40 ug via INTRAVENOUS
  Administered 2016-01-06: 80 ug via INTRAVENOUS
  Administered 2016-01-06: 40 ug via INTRAVENOUS
  Administered 2016-01-06: 80 ug via INTRAVENOUS
  Administered 2016-01-06: 40 ug via INTRAVENOUS

## 2016-01-06 MED ORDER — SODIUM CHLORIDE 0.9 % IR SOLN
Status: DC | PRN
  Administered 2016-01-06: 5000 mL

## 2016-01-06 MED ORDER — ONDANSETRON HCL 4 MG/2ML IJ SOLN: 4.0000 mg | INTRAMUSCULAR | Status: DC | PRN

## 2016-01-06 MED ORDER — METFORMIN HCL 500 MG PO TABS: 500.0000 mg | ORAL_TABLET | Freq: Every day | ORAL | Status: DC

## 2016-01-06 MED ORDER — EPHEDRINE SULFATE 50 MG/ML IJ SOLN
INTRAMUSCULAR | Status: AC
  Filled 2016-01-06: qty 1

## 2016-01-06 MED ORDER — SULFAMETHOXAZOLE-TRIMETHOPRIM 800-160 MG PO TABS
1.0000 | ORAL_TABLET | Freq: Two times a day (BID) | ORAL | Status: DC
  Administered 2016-01-06 – 2016-01-07 (×3): 1 via ORAL
  Filled 2016-01-06 (×4): qty 1

## 2016-01-06 MED ORDER — ZOLPIDEM TARTRATE 5 MG PO TABS: 5.0000 mg | ORAL_TABLET | Freq: Every evening | ORAL | Status: DC | PRN

## 2016-01-06 SURGICAL SUPPLY — 19 items
BAG URINE DRAINAGE (UROLOGICAL SUPPLIES) ×4 IMPLANT
BAG URO CATCHER STRL LF (MISCELLANEOUS) ×4 IMPLANT
CATH FOLEY 3WAY 30CC 22FR (CATHETERS) ×4 IMPLANT
CLOTH BEACON ORANGE TIMEOUT ST (SAFETY) ×4 IMPLANT
ELECT REM PT RETURN 9FT ADLT (ELECTROSURGICAL) ×4
ELECTRODE REM PT RTRN 9FT ADLT (ELECTROSURGICAL) ×3 IMPLANT
EVACUATOR MICROVAS BLADDER (UROLOGICAL SUPPLIES) ×8 IMPLANT
GLOVE BIOGEL M 8.0 STRL (GLOVE) ×4 IMPLANT
GOWN STRL REUS W/ TWL XL LVL3 (GOWN DISPOSABLE) ×3 IMPLANT
GOWN STRL REUS W/TWL XL LVL3 (GOWN DISPOSABLE) ×8 IMPLANT
HOLDER FOLEY CATH W/STRAP (MISCELLANEOUS) ×4 IMPLANT
KIT ASPIRATION TUBING (SET/KITS/TRAYS/PACK) ×4 IMPLANT
LOOP CUT BIPOLAR 24F LRG (ELECTROSURGICAL) IMPLANT
MANIFOLD NEPTUNE II (INSTRUMENTS) ×4 IMPLANT
PACK CYSTO (CUSTOM PROCEDURE TRAY) ×4 IMPLANT
PROBE KIDNEY STONEBRKR 2.0X425 (ELECTROSURGICAL) ×4 IMPLANT
SYR 30ML LL (SYRINGE) ×2 IMPLANT
SYRINGE IRR TOOMEY STRL 70CC (SYRINGE) ×4 IMPLANT
TUBING CONNECTING 10 (TUBING) ×4 IMPLANT

## 2016-01-06 NOTE — Anesthesia Procedure Notes (Addendum)
Procedure Name: LMA Insertion Date/Time: 01/06/2016 12:51 PM Performed by: Maxwell Caul Pre-anesthesia Checklist: Patient identified, Emergency Drugs available, Suction available and Patient being monitored Patient Re-evaluated:Patient Re-evaluated prior to inductionOxygen Delivery Method: Circle System Utilized Preoxygenation: Pre-oxygenation with 100% oxygen Intubation Type: IV induction Ventilation: Mask ventilation without difficulty LMA: LMA inserted LMA Size: 5.0 Tube type: Oral Number of attempts: 1 Airway Equipment and Method: Stylet and Oral airway Placement Confirmation: ETT inserted through vocal cords under direct vision,  positive ETCO2 and breath sounds checked- equal and bilateral Tube secured with: Tape Dental Injury: Teeth and Oropharynx as per pre-operative assessment

## 2016-01-06 NOTE — Transfer of Care (Signed)
Immediate Anesthesia Transfer of Care Note  Patient: Kevin Clements  Procedure(s) Performed: Procedure(s): TRANSURETHRAL RESECTION OF THE PROSTATE WITH GYRUS INSTRUMENTS (N/A) CYSTOSCOPY WITH LITHOLAPAXY WITH STONEBREAKER (N/A)  Patient Location: PACU  Anesthesia Type:General  Level of Consciousness:  sedated, patient cooperative and responds to stimulation  Airway & Oxygen Therapy:Patient Spontanous Breathing and Patient connected to face mask oxgen  Post-op Assessment:  Report given to PACU RN and Post -op Vital signs reviewed and stable  Post vital signs:  Reviewed and stable  Last Vitals:  Filed Vitals:   01/06/16 1042  BP: 135/71  Pulse: 61  Temp: 36.3 C  Resp: 18    Complications: No apparent anesthesia complications

## 2016-01-06 NOTE — Progress Notes (Signed)
Pt asked to use bedside commode to have BM. While sitting on BSC, patient stated he felt lightheaded and became unresponsive. Pt came around slightly and was able to drink orange juice and get back in bed. VSS. CBG 242 after OJ. Patient placed in trendelenburg and returned to his baseline. A&Ox4. He stated he felt like his sugar dropped. Assessed patient's catheter and CBI, and noticed it had stopped flowing. Patient started c/o pressure in his bladder. Flushed catheter and pulled back to find several clots. Restarted CBI, and it began flowing well. Patient had no complaints after. Will continue to monitor and reassess.

## 2016-01-06 NOTE — H&P (Signed)
Urology History and Physical Exam  CC: bladder stones, large prostate  HPI: 75 year old male presents today for TURP and cystolitholapaxy.  The patient has symptomatic BPH as well as several large bladder stones. He has had cystolitholapaxy in the past, but has recurrent bladder calculi.  He recently presented with gross hematuria and dysuria.  History is below:  BLADDER CALCULI The patient has a history of urolithiasis, and in June, 2012, he underwent cystolitholapaxy of multiple bladder calculi. He did not have a TURP at the time, as he wanted treatment with medical therapy rather than surgery. ELEVATED PSA The patient was first referred to me in November, 2011. At that time, his PSA had gone from 4.3 in June to 18.95, in earlier November. He did have slight asymmetry of his prostate, I felt from a benign source. His PSA checked in December of 2011 was down to 6.51. It was rechecked in July of 2012, and was 5.4, with a 43% free to total ratio. I felt that his PSA elevation was due to a large gland. In January 2014, his PSA had increased to 7.39. At that time, I recommended ultrasound and biopsy of his prostate. That was performed on 12/11/2012. Prostatic size was measured at 107 mL. All cores were benign. One core revealed inflammation. His most recent PSA was checked by Dr. Maudie Mercury on 10/06/2015 and was 5.85. Corrected for finasteride, 11.7. BPH The patient does have a large gland palpably and cystoscopically, with resultant lower urinary tract symptoms and bladder calculi. Prostatic size, measured in 2014 on ultrasound at the time of his biopsy, was 107 mL. He was started on [place] in June, 2012. He dropped the tamsulosin, as it caused significant nasal congestion. He is on finasteride. URETERAL CALCULI In June, 2012, he presented with worsening renal insufficiency, with a creatinine up to 5. Evaluation revealed the patient to have a right distal ureteral stone. He underwent ureteroscopy and stone  extraction on 03/28/2011.      PMH: Past Medical History  Diagnosis Date  . Hypertension   . Hyperlipidemia   . History of skin cancer   . History of kidney stones   . History of bladder stone   . History of UTI   . BPH (benign prostatic hyperplasia)   . Elevated serum creatinine   . Diabetes mellitus without complication ([place])   . Cancer ([place])     hx skin cancer    PSH: Past Surgical History  Procedure Laterality Date  . Hand surgery  2005 /2011    bilateral  . Cyst removed  2012    from back  . Cystoscopy  2012    to remove kidney stone and bladder stone  . Tonsillectomy      Allergies: No Known Allergies  Medications: Prescriptions prior to admission  Medication Sig Dispense Refill Last Dose  . amLODipine (NORVASC) 5 MG tablet Take 5 mg by mouth every morning.   01/06/2016 at 0700  . aspirin EC 81 MG tablet Take 81 mg by mouth at bedtime.   12/31/2015  . carvedilol (COREG) 3.125 MG tablet Take 3.125 mg by mouth 2 (two) times daily.   01/06/2016 at 0700  . Cholecalciferol (VITAMIN D) 2000 units CAPS Take 1 capsule by mouth 2 (two) times daily.   01/05/2016 at Unknown time  . CINNAMON PO Take 1,000 mg by mouth 2 (two) times daily.   01/05/2016 at Unknown time  . finasteride (PROSCAR) 5 MG tablet Take 5 mg by mouth at  bedtime.   01/05/2016 at Unknown time  . glimepiride (AMARYL) 4 MG tablet Take 4 mg by mouth 2 (two) times daily.   01/05/2016 at Unknown time  . metFORMIN (GLUCOPHAGE) 500 MG tablet Take 500 mg by mouth at bedtime.     . Multiple Vitamin (MULTIVITAMIN WITH MINERALS) TABS tablet Take 1 tablet by mouth every morning.   01/05/2016 at Unknown time  . Omega-3 Fatty Acids (FISH OIL) 1200 MG CAPS Take 1 capsule by mouth every morning.   Past Week at Unknown time  . pioglitazone (ACTOS) 30 MG tablet Take 30 mg by mouth at bedtime.   01/05/2016 at hs  . simvastatin (ZOCOR) 20 MG tablet Take 20 mg by mouth at bedtime.   01/05/2016 at hs     Social History: Social History    Social History  . Marital Status: Married    Spouse Name: N/A  . Number of Children: N/A  . Years of Education: N/A   Occupational History  . Not on file.   Social History Main Topics  . Smoking status: Never Smoker   . Smokeless tobacco: Not on file  . Alcohol Use: Yes     Comment: 1 gl wine x5 per week  . Drug Use: No  . Sexual Activity: Not on file   Other Topics Concern  . Not on file   Social History Narrative    Family History: History reviewed. No pertinent family history.  Review of Systems: Genitourinary, constitutional, skin, eye, otolaryngeal, hematologic/lymphatic, cardiovascular, pulmonary, endocrine, musculoskeletal, gastrointestinal, neurological and psychiatric system(s) were reviewed and pertinent findings if present are noted and are otherwise negative.  Genitourinary: dysuria, weak urinary stream, urinary stream starts and stops, hematuria and erectile dysfunction, but no feelings of urinary urgency.                   Physical Exam: @VITALS2 @ General: No acute distress.  Awake. Head:  Normocephalic.  Atraumatic. ENT:  EOMI.  Mucous membranes moist Neck:  Supple.  No lymphadenopathy. CV:  S1 present. S2 present. Regular rate. Pulmonary: Equal effort bilaterally.  Clear to auscultation bilaterally. Abdomen: Soft.  none tender to palpation. Skin:  Normal turgor.  No visible rash. Extremity: No gross deformity of bilateral upper extremities.  No gross deformity of                             lower extremities. Neurologic: Alert. Appropriate mood.    Studies:  Recent Labs     01/05/16  0920  HGB  13.0  WBC  3.9*  PLT  137*    Recent Labs     01/05/16  0920  NA  143  K  4.4  CL  110  CO2  24  BUN  31*  CREATININE  1.27*  CALCIUM  9.6  GFRNONAA  55*  GFRAA  >60     No results for input(s): INR, APTT in the last 72 hours.  Invalid input(s): PT   Invalid input(s): ABG    Assessment:  BPH with obstruction  Bladder calculi,  large  Plan: Cystolitholapaxy, TURP using gyrus technology

## 2016-01-06 NOTE — Op Note (Signed)
Preoperative diagnosis: 1. Bladder outlet obstruction secondary to BPH 2. Bladder calculi, 4 separate stones, maximum diameter 12 mm  Postoperative diagnosis:  1. Bladder outlet obstruction secondary to BPH 2. Bladder calculi, see above  Procedure:  1. Cystoscopy 2. Transurethral resection of the prostate  Surgeon: [name]. July Nickson, [name]  Anesthesia: General  Complications: None  Drain: Foley catheter, 22 French 3-way to CBI  EBL: Minimal  Specimens: 1. Prostate chips, to pathology 2. Stone fragments, to the family    Indication: Kevin Clements is a patient with bladder outlet obstruction secondary to benign prostatic hyperplasia. He has an exceptionally large prostate, measured at well over 100 g previously, on ultrasound at the time of the prostate biopsy Additionally, he has for bladder calculi that have become symptomatic with dysuria and hematuria. After reviewing the management options for treatment, he elected to proceed with the above surgical procedure(s). We have discussed the potential benefits and risks of the procedure, side effects of the proposed treatment, the likelihood of the patient achieving the goals of the procedure, and any potential problems that might occur during the procedure or recuperation. Informed consent has been obtained.  Description of procedure:  The patient was identified in the holding area.He received preoperative antibiotics. He was then taken to the operating room. General anesthetic was administered.  The patient was then placed in the dorsal lithotomy position, prepped and draped in the usual sterile fashion. Timeout was then performed.  A resectoscope sheath was placed using the obturator, and the resectoscope, loop and telescope were placed.  The bladder was then systematically examined in its entirety. There were 4 calculi layering posteriorly on the bladder. There were rounded. There were no other pathologic processes  recognized on cystoscopy. I then passed the 28 French resectoscope sheath using the obturator. With the working bridge, I then introduced [employer], and fragmented the 4 calculi into multiple smaller fragments. These fragments were irrigated from the bladder as well as possible, although there were probably a dozen smaller fragments that were remaining at the time of completion of the procedure. However, at this point, there was a fair amount of bleeding from the large prostatic fossa, and at this point, I commenced with the TURP. The [facility] resectoscope element with the cutting loop was then placed.  The ureteral orifices were identified and marked so as to be avoided during the procedure.  The prostate adenoma was then resected utilizing loop cautery resection with the bipolar cutting loop.  The prostate adenoma from the bladder neck back to the verumontanum was resected beginning at the six o'clock position and then extended to include the right and left lobes of the prostate and anterior prostate, respectively. Care was taken not to resect distal to the verumontanum. The prostate was exceptionally large, and resection took a significant amount of time. However, at the termination of the resection, the prostatic fossa was wide open.  Hemostasis was then achieved with the cautery and the bladder was emptied and reinspected with no significant bleeding noted at the end of the procedure.  Resected chips were irrigated from the bladder with the evacuator and sent to pathology. At this point, there were still several small stone fragments remaining in the prostatic fossa in the bladder which were irrigated as well as possible.  A 3 way catheter was then placed into the bladder and placed on continuous bladder irrigation.  The patient appeared to tolerate the procedure well and without complications. The patient was able to be awakened  and transferred to the recovery unit in satisfactory condition. He  tolerated the procedure well.

## 2016-01-06 NOTE — Anesthesia Postprocedure Evaluation (Signed)
Anesthesia Post Note  Patient: Kevin Clements  Procedure(s) Performed: Procedure(s) (LRB): TRANSURETHRAL RESECTION OF THE PROSTATE WITH GYRUS INSTRUMENTS (N/A) CYSTOSCOPY WITH LITHOLAPAXY WITH STONEBREAKER (N/A)  Patient location during evaluation: PACU Anesthesia Type: General Level of consciousness: awake and alert Pain management: pain level controlled Vital Signs Assessment: post-procedure vital signs reviewed and stable Respiratory status: spontaneous breathing, nonlabored ventilation, respiratory function stable and patient connected to nasal cannula oxygen Cardiovascular status: blood pressure returned to baseline and stable Postop Assessment: no signs of nausea or vomiting Anesthetic complications: no    Last Vitals:  Filed Vitals:   01/06/16 1615 01/06/16 1630  BP: 101/67 108/66  Pulse: 67 64  Temp: 36.4 C 36.5 C  Resp: 13 16    Last Pain:  Filed Vitals:   01/06/16 1631  PainSc: 0-No pain                 Montez Hageman

## 2016-01-06 NOTE — Anesthesia Preprocedure Evaluation (Addendum)
Anesthesia Evaluation  Patient identified by MRN, date of birth, ID band Patient awake    Reviewed: Allergy & Precautions, NPO status , Patient's Chart, lab work & pertinent test results  Airway Mallampati: II  TM Distance: >3 FB Neck ROM: Full    Dental no notable dental hx.    Pulmonary neg pulmonary ROS,    Pulmonary exam normal breath sounds clear to auscultation       Cardiovascular hypertension, Pt. on medications Normal cardiovascular exam Rhythm:Regular Rate:Normal     Neuro/Psych negative neurological ROS  negative psych ROS   GI/Hepatic negative GI ROS, Neg liver ROS,   Endo/Other  diabetes, Type 2, Oral Hypoglycemic Agents  Renal/GU negative Renal ROS  negative genitourinary   Musculoskeletal negative musculoskeletal ROS (+)   Abdominal   Peds negative pediatric ROS (+)  Hematology negative hematology ROS (+)   Anesthesia Other Findings   Reproductive/Obstetrics negative OB ROS                            Anesthesia Physical Anesthesia Plan  ASA: II  Anesthesia Plan: General   Post-op Pain Management:    Induction: Intravenous  Airway Management Planned: LMA and Oral ETT  Additional Equipment:   Intra-op Plan:   Post-operative Plan: Extubation in OR  Informed Consent: I have reviewed the patients History and Physical, chart, labs and discussed the procedure including the risks, benefits and alternatives for the proposed anesthesia with the patient or authorized representative who has indicated his/her understanding and acceptance.   Dental advisory given  Plan Discussed with: CRNA  Anesthesia Plan Comments:         Anesthesia Quick Evaluation

## 2016-01-07 LAB — HEMOGLOBIN AND HEMATOCRIT, BLOOD
HCT: 23 % — ABNORMAL LOW (ref 39.0–52.0)
HCT: 20.1 % — ABNORMAL LOW (ref 39.0–52.0)
Hemoglobin: 7.8 g/dL — ABNORMAL LOW (ref 13.0–17.0)
Hemoglobin: 7 g/dL — ABNORMAL LOW (ref 13.0–17.0)

## 2016-01-07 LAB — BASIC METABOLIC PANEL
ANION GAP: 7 (ref 5–15)
BUN: 41 mg/dL — ABNORMAL HIGH (ref 6–20)
CHLORIDE: 109 mmol/L (ref 101–111)
CO2: 21 mmol/L — AB (ref 22–32)
CREATININE: 1.76 mg/dL — AB (ref 0.61–1.24)
Calcium: 8 mg/dL — ABNORMAL LOW (ref 8.9–10.3)
GFR calc non Af Amer: 37 mL/min — ABNORMAL LOW (ref >60)
GFR, EST AFRICAN AMERICAN: 43 mL/min — AB (ref >60)
Glucose, Bld: 362 mg/dL — ABNORMAL HIGH (ref 65–99)
POTASSIUM: 5.2 mmol/L — AB (ref 3.5–5.1)
SODIUM: 137 mmol/L (ref 135–145)

## 2016-01-07 LAB — GLUCOSE, CAPILLARY
GLUCOSE-CAPILLARY: 338 mg/dL — AB (ref 65–99)
GLUCOSE-CAPILLARY: 308 mg/dL — AB (ref 65–99)
GLUCOSE-CAPILLARY: 246 mg/dL — AB (ref 65–99)
Glucose-Capillary: 235 mg/dL — ABNORMAL HIGH (ref 65–99)

## 2016-01-07 LAB — ABO/RH: ABO/RH(D): A POS

## 2016-01-07 LAB — PREPARE RBC (CROSSMATCH)

## 2016-01-07 MED ORDER — SODIUM CHLORIDE 0.9 % IV SOLN: Freq: Once | INTRAVENOUS | Status: DC

## 2016-01-07 MED ORDER — INSULIN ASPART 100 UNIT/ML ~~LOC~~ SOLN
0.0000 [IU] | Freq: Three times a day (TID) | SUBCUTANEOUS | Status: DC
  Administered 2016-01-07: 11 [IU] via SUBCUTANEOUS
  Administered 2016-01-07 (×2): 5 [IU] via SUBCUTANEOUS
  Administered 2016-01-08: 2 [IU] via SUBCUTANEOUS

## 2016-01-07 MED ORDER — INSULIN ASPART 100 UNIT/ML ~~LOC~~ SOLN: 0.0000 [IU] | Freq: Every day | SUBCUTANEOUS | Status: DC

## 2016-01-07 NOTE — Progress Notes (Signed)
Patient has had a good day. He is not in any pain. Minimal CBI, urine is dark but without clots.  Due to his persistent anemia, hemoglobin now 7, I will transfuse 2 units of packed red blood cells. I discussed with the patient the risks and benefits of this. He agrees to proceed.

## 2016-01-07 NOTE — Progress Notes (Signed)
1 Day Post-Op Subjective: Patient reports feeling ok now. Had clot retention early this am. Appropriately treated by nursing staff.  Objective: Vital signs in last 24 hours: Temp:  [97.3 F (36.3 C)-98.1 F (36.7 C)] 97.5 F (36.4 C) (04/07 0528) Pulse Rate:  [61-86] 81 (04/07 0528) Resp:  [12-18] 18 (04/07 0528) BP: (60-135)/(34-71) 106/54 mmHg (04/07 0528) SpO2:  [98 %-100 %] 98 % (04/07 0528) Weight:  [97.977 kg (216 lb)] 97.977 kg (216 lb) (04/06 1051)  Intake/Output from previous day: 04/06 0701 - 04/07 0700 In: 20577.5 [I.V.:1907.5] Out: 23800 [Urine:23750; Blood:50] Intake/Output this shift:    Physical Exam:  Constitutional: Vital signs reviewed. WD WN in NAD  . Somewhat pale. Eyes: PERRL, No scleral icterus.   Pulmonary/Chest: Normal effort Extremities: No cyanosis or edema   Bladder irrigant clear, pink tinged, and no clots Lab Results:  Recent Labs  01/05/16 0920 01/06/16 2313 01/07/16 0505  HGB 13.0 8.5* 7.8*  HCT 39.7 24.5* 23.0*   BMET  Recent Labs  01/06/16 1735 01/07/16 0505  NA 138 137  K 5.2* 5.2*  CL 108 109  CO2 23 21*  GLUCOSE 193* 362*  BUN 31* 41*  CREATININE 1.50* 1.76*  CALCIUM 8.4* 8.0*   No results for input(s): LABPT, INR in the last 72 hours. No results for input(s): LABURIN in the last 72 hours. Results for orders placed or performed during the hospital encounter of 03/22/11  Stool culture     Status: None   Collection Time: 03/23/11  3:26 AM  Result Value Ref Range Status   Specimen Description STOOL  Final   Special Requests IMMUNE:NORM  Final   Culture   Final    NO SALMONELLA, SHIGELLA, CAMPYLOBACTER, OR YERSINIA ISOLATED   Report Status 03/26/2011 FINAL  Final    Studies/Results: No results found.  Assessment/Plan:   Postoperative day #1 cystolitholapaxy and TURP a very large prostate. He is anemic. We will follow that with repeat hemoglobin later on this afternoon with possible transfusion. Currently, CBI is  running clear. I plan on leaving him at least next today. We will cover his hyperglycemia with sliding scale insulin coverage.    Nursing staff did an excellent job last night in managing this patient's clots-appropriately irrigated.     Jorja Loa 01/07/2016, 7:30 AM

## 2016-01-07 NOTE — Progress Notes (Addendum)
Pt stated he felt pressure in his bladder and felt like his catheter was not flowing. Catheter irrigated and found to have many clots. Pt was then hooked back to cbi and cbi began to flow with red output that changed changed to light pink. Pt then started to feel light headed and "fllushed".  Pt looked pale, automatic BP taken and was 60/34, rechecked BP manually and was 90/45. On call made aware and orders given to increase fluids, a 1000 ml bolus and STAT H&H. Hgb was 8.5 and BP 15 minutes after bolus completion was 97/53. Pt stated to be feeling much better, just tired. On call made aware and orders to increase fluids and check labs in the morning were given. Will continue to monitor.

## 2016-01-08 DIAGNOSIS — N138 Other obstructive and reflux uropathy: Secondary | ICD-10-CM

## 2016-01-08 DIAGNOSIS — D62 Acute posthemorrhagic anemia: Secondary | ICD-10-CM

## 2016-01-08 DIAGNOSIS — N401 Enlarged prostate with lower urinary tract symptoms: Secondary | ICD-10-CM

## 2016-01-08 LAB — HEMOGLOBIN AND HEMATOCRIT, BLOOD
HCT: 23.5 % — ABNORMAL LOW (ref 39.0–52.0)
HEMOGLOBIN: 8.5 g/dL — AB (ref 13.0–17.0)

## 2016-01-08 LAB — GLUCOSE, CAPILLARY
GLUCOSE-CAPILLARY: 162 mg/dL — AB (ref 65–99)
Glucose-Capillary: 175 mg/dL — ABNORMAL HIGH (ref 65–99)

## 2016-01-08 NOTE — Discharge Instructions (Signed)
Transurethral Resection of the Prostate, Care After °Refer to this sheet in the next few weeks. These instructions provide you with information on caring for yourself after your procedure. Your caregiver also may give you specific instructions. Your treatment has been planned according to current medical practices, but complications sometimes occur. Call your caregiver if you have any problems or questions after your procedure. °HOME CARE INSTRUCTIONS  °Recovery can take 4-6 weeks. Avoid alcohol, caffeinated drinks, and spicy foods for 2 weeks after your procedure. Drink enough fluids to keep your urine clear or pale yellow. Urinate as soon as you feel the urge to do so. Do not try to hold your urine for long periods of time. °During recovery you may experience pain caused by bladder spasms, which result in a very intense urge to urinate. Take all medicines as directed by your caregiver, including medicines for pain. Try to limit the amount of pain medicines you take because it can cause constipation. If you do become constipated, do not strain to move your bowels. Straining can increase bleeding. Constipation can be minimized by increasing the amount fluids and fiber in your diet. Your caregiver also may prescribe a stool softener. °Do not lift heavy objects (more than 5 lb [2.25 kg]) or perform exercises that cause you to strain for at least 1 month after your procedure. When sitting, you may want to sit in a soft chair or use a cushion. For the first 10 days after your procedure, avoid the following activities: °· Running. °· Strenuous work. °· Long walks. °· Riding in a car for extended periods. °· Sex. °SEEK MEDICAL CARE IF: °· You have difficulty urinating. °· You have blood in your urine that does not go away after you rest or increase your fluid intake. °· You have swelling in your penis or scrotum. °SEEK IMMEDIATE MEDICAL CARE IF:  °· You are suddenly unable to urinate. °· You notice blood clots in your  urine. °· You have chills. °· You have a fever. °· You have pain in your back or lower abdomen. °· You have pain or swelling in your legs. °MAKE SURE YOU:  °· Understand these instructions. °· Will watch your condition. °· Will get help right away if you are not doing well or get worse. °  °This information is not intended to replace advice given to you by your health care provider. Make sure you discuss any questions you have with your health care provider. °  °Document Released: 09/18/2005 Document Revised: 10/09/2014 Document Reviewed: 10/27/2011 °Elsevier Interactive Patient Education ©2016 [employer] ° °

## 2016-01-08 NOTE — Discharge Summary (Signed)
Physician Discharge Summary  Patient ID: Kevin Clements MRN: [id number] DOB/AGE: 73-11-1940 73 y.o.  Admit date: 01/06/2016 Discharge date: 01/08/2016  Admission Diagnoses:  Bladder calculus  Discharge Diagnoses:  Principal Problem:   Bladder calculus Active Problems:   BPH (benign prostatic hypertrophy) with urinary obstruction   Postoperative anemia due to acute blood loss   Past Medical History  Diagnosis Date  . Hypertension   . Hyperlipidemia   . History of skin cancer   . History of kidney stones   . History of bladder stone   . History of UTI   . BPH (benign prostatic hyperplasia)   . Elevated serum creatinine   . Diabetes mellitus without complication ([place])   . Cancer ([place])     hx skin cancer    Surgeries: Procedure(s): TRANSURETHRAL RESECTION OF THE PROSTATE WITH GYRUS INSTRUMENTS CYSTOSCOPY WITH LITHOLAPAXY WITH STONEBREAKER on 01/06/2016   Consultants (if any):    Discharged Condition: Improved  Hospital Course: Bram Hasser is an 75 y.o. male who was admitted 01/06/2016 with a diagnosis of Bladder calculus and went to the operating room on 01/06/2016 and underwent the above named procedures.   He had a large prostate requiring extensive resection and had significant postop bleeding with acute blood loss anemia with a drop in Hgb from 13.5 to 7.  He was transfused with 2 units to 8.5.  He has mild ARI with a Cr of 1.5.  The foley was removed today and he is voiding well with amber urine.   He is felt to be ready for discharge.    He was given perioperative antibiotics:  Anti-infectives    Start     Dose/Rate Route Frequency Ordered Stop   01/06/16 2200  sulfamethoxazole-trimethoprim (BACTRIM DS,SEPTRA DS) 800-160 MG per tablet 1 tablet     1 tablet Oral Every 12 hours 01/06/16 1636     01/06/16 1050  ceFAZolin (ANCEF) IVPB 2g/100 mL premix     2 g 200 mL/hr over 30 Minutes Intravenous 30 min pre-op 01/06/16 1050 01/06/16 1253   01/06/16 0000   sulfamethoxazole-trimethoprim (BACTRIM DS,SEPTRA DS) 800-160 MG tablet     1 tablet Oral 2 times daily 01/06/16 1530      .  He was given sequential compression devices for DVT prophylaxis.  He benefited maximally from the hospital stay but the procedure was complicated by hemorrhage with ABL anemia.    Recent vital signs:  Filed Vitals:   01/08/16 0005 01/08/16 0530  BP: 112/52 113/61  Pulse: 73 65  Temp: 97.5 F (36.4 C) 97.8 F (36.6 C)  Resp: 16 18    Recent laboratory studies:  Lab Results  Component Value Date   HGB 8.5* 01/08/2016   HGB 7.0* 01/07/2016   HGB 7.8* 01/07/2016   Lab Results  Component Value Date   WBC 3.9* 01/05/2016   PLT 137* 01/05/2016   No results found for: INR Lab Results  Component Value Date   NA 137 01/07/2016   K 5.2* 01/07/2016   CL 109 01/07/2016   CO2 21* 01/07/2016   BUN 41* 01/07/2016   CREATININE 1.76* 01/07/2016   GLUCOSE 362* 01/07/2016    Discharge Medications:     Medication List    STOP taking these medications        aspirin EC 81 MG tablet     finasteride 5 MG tablet  Commonly known as:  PROSCAR      TAKE these medications  amLODipine 5 MG tablet  Commonly known as:  NORVASC  Take 5 mg by mouth every morning.     carvedilol 3.125 MG tablet  Commonly known as:  COREG  Take 3.125 mg by mouth 2 (two) times daily.     CINNAMON PO  Take 1,000 mg by mouth 2 (two) times daily.     Fish Oil 1200 MG Caps  Take 1 capsule by mouth every morning.     glimepiride 4 MG tablet  Commonly known as:  AMARYL  Take 4 mg by mouth 2 (two) times daily.     metFORMIN 500 MG tablet  Commonly known as:  GLUCOPHAGE  Take 500 mg by mouth at bedtime.     multivitamin with minerals Tabs tablet  Take 1 tablet by mouth every morning.     pioglitazone 30 MG tablet  Commonly known as:  ACTOS  Take 30 mg by mouth at bedtime.     simvastatin 20 MG tablet  Commonly known as:  ZOCOR  Take 20 mg by mouth at bedtime.      sulfamethoxazole-trimethoprim 800-160 MG tablet  Commonly known as:  BACTRIM DS,SEPTRA DS  Take 1 tablet by mouth 2 (two) times daily.     Vitamin D 2000 units Caps  Take 1 capsule by mouth 2 (two) times daily.        Diagnostic Studies: No results found.  Disposition: 01-Home or Self Care        Follow-up Information    Follow up with DAHLSTEDT, [name], MD.   Specialty:  Urology   Why:  If you are not already scheduled, call the office to be seen in 2-3 weeks.    Contact information:   [address] [place] [id number] [phone]        Signed: [name] 01/08/2016, 6:58 AM

## 2016-01-09 ENCOUNTER — Inpatient Hospital Stay (HOSPITAL_COMMUNITY)
Admission: EM | Admit: 2016-01-09 | Discharge: 2016-01-13 | DRG: 872 | Disposition: A | Discharge status: Home / Self Care | Payer: Medicare Other | Attending: Internal Medicine | Admitting: Internal Medicine

## 2016-01-09 ENCOUNTER — Encounter (HOSPITAL_COMMUNITY): Payer: Self-pay | Admitting: Emergency Medicine

## 2016-01-09 ENCOUNTER — Emergency Department (HOSPITAL_COMMUNITY): Payer: Medicare Other

## 2016-01-09 ENCOUNTER — Other Ambulatory Visit: Payer: Self-pay

## 2016-01-09 DIAGNOSIS — A419 Sepsis, unspecified organism: Secondary | ICD-10-CM | POA: Diagnosis not present

## 2016-01-09 DIAGNOSIS — E860 Dehydration: Secondary | ICD-10-CM | POA: Diagnosis present

## 2016-01-09 DIAGNOSIS — R7881 Bacteremia: Secondary | ICD-10-CM | POA: Insufficient documentation

## 2016-01-09 DIAGNOSIS — N179 Acute kidney failure, unspecified: Secondary | ICD-10-CM | POA: Diagnosis not present

## 2016-01-09 DIAGNOSIS — Z9079 Acquired absence of other genital organ(s): Secondary | ICD-10-CM

## 2016-01-09 DIAGNOSIS — Z87440 Personal history of urinary (tract) infections: Secondary | ICD-10-CM

## 2016-01-09 DIAGNOSIS — R339 Retention of urine, unspecified: Secondary | ICD-10-CM | POA: Diagnosis not present

## 2016-01-09 DIAGNOSIS — E785 Hyperlipidemia, unspecified: Secondary | ICD-10-CM | POA: Diagnosis present

## 2016-01-09 DIAGNOSIS — Z7984 Long term (current) use of oral hypoglycemic drugs: Secondary | ICD-10-CM

## 2016-01-09 DIAGNOSIS — R1110 Vomiting, unspecified: Secondary | ICD-10-CM | POA: Diagnosis not present

## 2016-01-09 DIAGNOSIS — D72819 Decreased white blood cell count, unspecified: Secondary | ICD-10-CM | POA: Diagnosis present

## 2016-01-09 DIAGNOSIS — N390 Urinary tract infection, site not specified: Secondary | ICD-10-CM | POA: Diagnosis not present

## 2016-01-09 DIAGNOSIS — Z87442 Personal history of urinary calculi: Secondary | ICD-10-CM

## 2016-01-09 DIAGNOSIS — N183 Chronic kidney disease, stage 3 (moderate): Secondary | ICD-10-CM | POA: Diagnosis present

## 2016-01-09 DIAGNOSIS — D62 Acute posthemorrhagic anemia: Secondary | ICD-10-CM | POA: Diagnosis not present

## 2016-01-09 DIAGNOSIS — Z79899 Other long term (current) drug therapy: Secondary | ICD-10-CM | POA: Diagnosis not present

## 2016-01-09 DIAGNOSIS — E872 Acidosis: Secondary | ICD-10-CM | POA: Diagnosis present

## 2016-01-09 DIAGNOSIS — E1122 Type 2 diabetes mellitus with diabetic chronic kidney disease: Secondary | ICD-10-CM | POA: Diagnosis present

## 2016-01-09 DIAGNOSIS — Z833 Family history of diabetes mellitus: Secondary | ICD-10-CM | POA: Diagnosis not present

## 2016-01-09 DIAGNOSIS — I129 Hypertensive chronic kidney disease with stage 1 through stage 4 chronic kidney disease, or unspecified chronic kidney disease: Secondary | ICD-10-CM | POA: Diagnosis present

## 2016-01-09 DIAGNOSIS — Z800 Family history of malignant neoplasm of digestive organs: Secondary | ICD-10-CM | POA: Diagnosis not present

## 2016-01-09 DIAGNOSIS — A4181 Sepsis due to Enterococcus: Principal | ICD-10-CM | POA: Diagnosis present

## 2016-01-09 DIAGNOSIS — R319 Hematuria, unspecified: Secondary | ICD-10-CM

## 2016-01-09 DIAGNOSIS — N401 Enlarged prostate with lower urinary tract symptoms: Secondary | ICD-10-CM | POA: Diagnosis not present

## 2016-01-09 DIAGNOSIS — D696 Thrombocytopenia, unspecified: Secondary | ICD-10-CM | POA: Diagnosis present

## 2016-01-09 DIAGNOSIS — N138 Other obstructive and reflux uropathy: Secondary | ICD-10-CM | POA: Diagnosis present

## 2016-01-09 DIAGNOSIS — B952 Enterococcus as the cause of diseases classified elsewhere: Secondary | ICD-10-CM | POA: Diagnosis not present

## 2016-01-09 DIAGNOSIS — Z8249 Family history of ischemic heart disease and other diseases of the circulatory system: Secondary | ICD-10-CM

## 2016-01-09 DIAGNOSIS — R0602 Shortness of breath: Secondary | ICD-10-CM | POA: Diagnosis not present

## 2016-01-09 DIAGNOSIS — A4151 Sepsis due to Escherichia coli [E. coli]: Secondary | ICD-10-CM | POA: Diagnosis not present

## 2016-01-09 DIAGNOSIS — Z85828 Personal history of other malignant neoplasm of skin: Secondary | ICD-10-CM | POA: Diagnosis not present

## 2016-01-09 DIAGNOSIS — N320 Bladder-neck obstruction: Secondary | ICD-10-CM | POA: Diagnosis present

## 2016-01-09 DIAGNOSIS — Z960 Presence of urogenital implants: Secondary | ICD-10-CM | POA: Diagnosis not present

## 2016-01-09 LAB — URINALYSIS, ROUTINE W REFLEX MICROSCOPIC
Bilirubin Urine: NEGATIVE
GLUCOSE, UA: 500 mg/dL — AB
Ketones, ur: NEGATIVE mg/dL
Nitrite: NEGATIVE
PH: 6 (ref 5.0–8.0)
Protein, ur: 100 mg/dL — AB
SPECIFIC GRAVITY, URINE: 1.02 (ref 1.005–1.030)

## 2016-01-09 LAB — BASIC METABOLIC PANEL
Anion gap: 8 (ref 5–15)
BUN: 38 mg/dL — ABNORMAL HIGH (ref 6–20)
CO2: 22 mmol/L (ref 22–32)
CREATININE: 1.9 mg/dL — AB (ref 0.61–1.24)
Calcium: 9 mg/dL (ref 8.9–10.3)
Chloride: 109 mmol/L (ref 101–111)
GFR calc non Af Amer: 34 mL/min — ABNORMAL LOW (ref >60)
GFR, EST AFRICAN AMERICAN: 39 mL/min — AB (ref >60)
Glucose, Bld: 177 mg/dL — ABNORMAL HIGH (ref 65–99)
Potassium: 4.7 mmol/L (ref 3.5–5.1)
Sodium: 139 mmol/L (ref 135–145)

## 2016-01-09 LAB — CBC WITH DIFFERENTIAL/PLATELET
Basophils Absolute: 0 10*3/uL (ref 0.0–0.1)
Basophils Relative: 0 %
EOS PCT: 0 %
Eosinophils Absolute: 0 10*3/uL (ref 0.0–0.7)
HEMATOCRIT: 24.7 % — AB (ref 39.0–52.0)
HEMOGLOBIN: 8.4 g/dL — AB (ref 13.0–17.0)
LYMPHS ABS: 0.3 10*3/uL — AB (ref 0.7–4.0)
Lymphocytes Relative: 10 %
MCH: 31.7 pg (ref 26.0–34.0)
MCHC: 34 g/dL (ref 30.0–36.0)
MCV: 93.2 fL (ref 78.0–100.0)
MONOS PCT: 0 %
Monocytes Absolute: 0 10*3/uL — ABNORMAL LOW (ref 0.1–1.0)
NEUTROS ABS: 2.3 10*3/uL (ref 1.7–7.7)
Neutrophils Relative %: 90 %
Platelets: 99 10*3/uL — ABNORMAL LOW (ref 150–400)
RBC: 2.65 MIL/uL — AB (ref 4.22–5.81)
RDW: 15.4 % (ref 11.5–15.5)
WBC Morphology: INCREASED
WBC: 2.6 10*3/uL — ABNORMAL LOW (ref 4.0–10.5)

## 2016-01-09 LAB — URINE MICROSCOPIC-ADD ON

## 2016-01-09 LAB — I-STAT CG4 LACTIC ACID, ED
Lactic Acid, Venous: 2.33 mmol/L (ref 0.5–2.0)
Lactic Acid, Venous: 2.58 mmol/L (ref 0.5–2.0)

## 2016-01-09 LAB — CBG MONITORING, ED: Glucose-Capillary: 157 mg/dL — ABNORMAL HIGH (ref 65–99)

## 2016-01-09 MED ORDER — CARVEDILOL 3.125 MG PO TABS
3.1250 mg | ORAL_TABLET | Freq: Two times a day (BID) | ORAL | Status: DC
  Filled 2016-01-09: qty 1

## 2016-01-09 MED ORDER — SODIUM CHLORIDE 0.9 % IV SOLN
INTRAVENOUS | Status: DC
  Administered 2016-01-10: 01:00:00 via INTRAVENOUS

## 2016-01-09 MED ORDER — ONDANSETRON HCL 4 MG PO TABS: 4.0000 mg | ORAL_TABLET | Freq: Four times a day (QID) | ORAL | Status: DC | PRN

## 2016-01-09 MED ORDER — INSULIN ASPART 100 UNIT/ML ~~LOC~~ SOLN
0.0000 [IU] | Freq: Three times a day (TID) | SUBCUTANEOUS | Status: DC
Start: 2016-01-10 — End: 2016-01-13
  Administered 2016-01-10 – 2016-01-12 (×3): 1 [IU] via SUBCUTANEOUS
  Administered 2016-01-12: 2 [IU] via SUBCUTANEOUS
  Administered 2016-01-12 – 2016-01-13 (×3): 1 [IU] via SUBCUTANEOUS

## 2016-01-09 MED ORDER — SODIUM CHLORIDE 0.9% FLUSH
3.0000 mL | Freq: Two times a day (BID) | INTRAVENOUS | Status: DC
  Administered 2016-01-10 – 2016-01-13 (×6): 3 mL via INTRAVENOUS

## 2016-01-09 MED ORDER — ACETAMINOPHEN 325 MG PO TABS
650.0000 mg | ORAL_TABLET | Freq: Four times a day (QID) | ORAL | Status: DC | PRN
  Administered 2016-01-10 (×2): 650 mg via ORAL
  Filled 2016-01-09 (×2): qty 2

## 2016-01-09 MED ORDER — ONDANSETRON HCL 4 MG/2ML IJ SOLN
4.0000 mg | Freq: Four times a day (QID) | INTRAMUSCULAR | Status: DC | PRN
  Filled 2016-01-09: qty 2

## 2016-01-09 MED ORDER — SIMVASTATIN 20 MG PO TABS
20.0000 mg | ORAL_TABLET | Freq: Every day | ORAL | Status: DC
  Administered 2016-01-10 – 2016-01-12 (×4): 20 mg via ORAL
  Filled 2016-01-09 (×7): qty 1

## 2016-01-09 MED ORDER — ONDANSETRON HCL 4 MG/2ML IJ SOLN
4.0000 mg | Freq: Once | INTRAMUSCULAR | Status: AC
  Administered 2016-01-09: 4 mg via INTRAVENOUS
  Filled 2016-01-09: qty 2

## 2016-01-09 MED ORDER — DEXTROSE 5 % IV SOLN: 1.0000 g | INTRAVENOUS | Status: DC

## 2016-01-09 MED ORDER — SODIUM CHLORIDE 0.9 % IV BOLUS (SEPSIS)
1000.0000 mL | INTRAVENOUS | Status: AC
  Administered 2016-01-09 – 2016-01-10 (×3): 1000 mL via INTRAVENOUS

## 2016-01-09 MED ORDER — AMLODIPINE BESYLATE 5 MG PO TABS
5.0000 mg | ORAL_TABLET | Freq: Every morning | ORAL | Status: DC
  Filled 2016-01-09: qty 1

## 2016-01-09 MED ORDER — DEXTROSE 5 % IV SOLN
2.0000 g | Freq: Once | INTRAVENOUS | Status: AC
  Administered 2016-01-09: 2 g via INTRAVENOUS
  Filled 2016-01-09: qty 2

## 2016-01-09 MED ORDER — ACETAMINOPHEN 650 MG RE SUPP: 650.0000 mg | Freq: Four times a day (QID) | RECTAL | Status: DC | PRN

## 2016-01-09 MED ORDER — ACETAMINOPHEN 650 MG RE SUPP
650.0000 mg | Freq: Once | RECTAL | Status: AC
  Administered 2016-01-09: 650 mg via RECTAL
  Filled 2016-01-09: qty 1

## 2016-01-09 NOTE — ED Provider Notes (Signed)
CSN: [id number]     Arrival date & time 01/09/16  1927 History   First MD Initiated Contact with Patient 01/09/16 1935     Chief Complaint  Patient presents with  . Urinary Retention     (Consider location/radiation/quality/duration/timing/severity/associated sxs/prior Treatment) HPI Comments: 75 y/o male, s/p TURP 3 days ago, here w/ urinary retention x 7 hours Endorses suprapubic pressure w/o fever, vomiting Has had some hematuria but denies flank pain Sx persistent and nothing makes them better No tx used pta  The history is provided by the patient.    Past Medical History  Diagnosis Date  . Hypertension   . Hyperlipidemia   . History of skin cancer   . History of kidney stones   . History of bladder stone   . History of UTI   . BPH (benign prostatic hyperplasia)   . Elevated serum creatinine   . Diabetes mellitus without complication ([place])   . Cancer ([place])     hx skin cancer   Past Surgical History  Procedure Laterality Date  . Hand surgery  2005 /2011    bilateral  . Cyst removed  2012    from back  . Cystoscopy  2012    to remove kidney stone and bladder stone  . Tonsillectomy    . Transurethral resection of prostate N/A 01/06/2016    Procedure: TRANSURETHRAL RESECTION OF THE PROSTATE WITH GYRUS INSTRUMENTS;  Surgeon: Franchot Gallo, MD;  Location: [facility];  Service: Urology;  Laterality: N/A;  . Cystoscopy with litholapaxy N/A 01/06/2016    Procedure: CYSTOSCOPY WITH LITHOLAPAXY WITH Jobe Gibbon;  Surgeon: Franchot Gallo, MD;  Location: [facility];  Service: Urology;  Laterality: N/A;   History reviewed. No pertinent family history. Social History  Substance Use Topics  . Smoking status: Never Smoker   . Smokeless tobacco: None  . Alcohol Use: Yes     Comment: 1 gl wine x5 per week    Review of Systems  All other systems reviewed and are negative.     Allergies  Review of patient's allergies indicates no known allergies.  Home Medications   Prior  to Admission medications   Medication Sig Start Date End Date Taking? Authorizing Provider  amLODipine (NORVASC) 5 MG tablet Take 5 mg by mouth every morning.    Historical Provider, MD  carvedilol (COREG) 3.125 MG tablet Take 3.125 mg by mouth 2 (two) times daily.    Historical Provider, MD  Cholecalciferol (VITAMIN D) 2000 units CAPS Take 1 capsule by mouth 2 (two) times daily.    Historical Provider, MD  CINNAMON PO Take 1,000 mg by mouth 2 (two) times daily.    Historical Provider, MD  glimepiride (AMARYL) 4 MG tablet Take 4 mg by mouth 2 (two) times daily.    Historical Provider, MD  metFORMIN (GLUCOPHAGE) 500 MG tablet Take 500 mg by mouth at bedtime.    Historical Provider, MD  Multiple Vitamin (MULTIVITAMIN WITH MINERALS) TABS tablet Take 1 tablet by mouth every morning.    Historical Provider, MD  Omega-3 Fatty Acids (FISH OIL) 1200 MG CAPS Take 1 capsule by mouth every morning.    Historical Provider, MD  pioglitazone (ACTOS) 30 MG tablet Take 30 mg by mouth at bedtime.    Historical Provider, MD  simvastatin (ZOCOR) 20 MG tablet Take 20 mg by mouth at bedtime.    Historical Provider, MD  sulfamethoxazole-trimethoprim (BACTRIM DS,SEPTRA DS) 800-160 MG tablet Take 1 tablet by mouth 2 (two) times daily. 01/06/16  Franchot Gallo, MD   BP 149/75 mmHg  Pulse 100  Temp(Src) 98.2 F (36.8 C) (Oral)  Resp 24  Ht 6\' 1"  (1.854 m)  Wt 95.255 kg  BMI 27.71 kg/m2  SpO2 100% Physical Exam  Constitutional: He is oriented to person, place, and time. He appears well-developed and well-nourished.  Non-toxic appearance. No distress.  HENT:  Head: Normocephalic and atraumatic.  Eyes: Conjunctivae, EOM and lids are normal. Pupils are equal, round, and reactive to light.  Neck: Normal range of motion. Neck supple. No tracheal deviation present. No thyroid mass present.  Cardiovascular: Normal rate, regular rhythm and normal heart sounds.  Exam reveals no gallop.   No murmur  heard. Pulmonary/Chest: Effort normal and breath sounds normal. No stridor. No respiratory distress. He has no decreased breath sounds. He has no wheezes. He has no rhonchi. He has no rales.  Abdominal: Soft. Normal appearance and bowel sounds are normal. He exhibits no distension. There is no tenderness. There is no rebound and no CVA tenderness.  Musculoskeletal: Normal range of motion. He exhibits no edema or tenderness.  Neurological: He is alert and oriented to person, place, and time. He has normal strength. No cranial nerve deficit or sensory deficit. GCS eye subscore is 4. GCS verbal subscore is 5. GCS motor subscore is 6.  Skin: Skin is warm and dry. No abrasion and no rash noted.  Psychiatric: He has a normal mood and affect. His speech is normal and behavior is normal.  Nursing note and vitals reviewed.   ED Course  Procedures (including critical care time) Labs Review Labs Reviewed  URINALYSIS, ROUTINE W REFLEX MICROSCOPIC (NOT AT [facility])    Imaging Review No results found. I have personally reviewed and evaluated these images and lab results as part of my medical decision-making.   EKG Interpretation None      MDM   Final diagnoses:  None    The patient initially presented he was here for urinary retention. He was afebrile. Patient then developed a fever. Blood cultures and lactate obtained. Patient started on antibiotics for urinary source of infection. Patient given bolus of saline 30 mL/kg. [facility] consult urology. Spoke with hospitalist and patient will be admitted  CRITICAL CARE Performed by: Leota Jacobsen Total critical care time: 50 minutes Critical care time was exclusive of separately billable procedures and treating other patients. Critical care was necessary to treat or prevent imminent or life-threatening deterioration. Critical care was time spent personally by me on the following activities: development of treatment plan with patient and/or surrogate as  well as nursing, discussions with consultants, evaluation of patient's response to treatment, examination of patient, obtaining history from patient or surrogate, ordering and performing treatments and interventions, ordering and review of laboratory studies, ordering and review of radiographic studies, pulse oximetry and re-evaluation of patient's condition.   Lacretia Leigh, MD 01/09/16 2221

## 2016-01-09 NOTE — H&P (Signed)
[facility] History and Physical  Kevin Clements [id number] DOB: January 17, 1941 DOA: 01/09/2016  PCP: Jani Gravel, MD  Specialists: Dr. Diona Fanti ([place])  Chief Complaint: Acute urinary retention, hematuria  HPI: Kevin Clements is a 75 y.o. 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  He is S/P TURP.  His postoperative course was complicated by ABLA; he required 2 units of PRBCs.  He was discharged home with a hemoglobin of 8.5 (it was normal on previous admission).  Foley catheter was removed prior to discharge.  In the past 24 hours, the patient has noted continued hematuria, decreased urine output, and bladder distention.  He has had persistent weakness and DOE, which he attributes to his anemia.  He has had nausea but no significant vomiting.  He is appetite has been normal.  No chest pain, light-headedness, headache, or abdominal pain.  He presented to the ED because he though he would have a foley catheter placed for the retention and expected to follow-up with his urologist in clinic this week.  However, he spiked a fever to 103.8 with associated tachycardia (heart rate 120's).  Sepsis protocol was activated.  The patient has received empiric Cefepime and aggressive IV fluid resuscitation has been initiated.  Blood and urine cultures are pending.  Hospitalist asked to admit for further evaluation and treatment.  Review of Systems: 12 systems reviewed and negative except as stated in the HPI.  Past Medical History  Diagnosis Date  . Hypertension   . Hyperlipidemia   . History of skin cancer   . History of kidney stones   . History of bladder stone   . History of UTI   . BPH (benign prostatic hyperplasia)   . Elevated serum creatinine   . Diabetes mellitus without complication ([place])   . Cancer ([place])     hx skin cancer   Past Surgical  History  Procedure Laterality Date  . Hand surgery  2005 /2011    bilateral  . Cyst removed  2012    from back  . Cystoscopy  2012    to remove kidney stone and bladder stone  . Tonsillectomy    . Transurethral resection of prostate N/A 01/06/2016    Procedure: TRANSURETHRAL RESECTION OF THE PROSTATE WITH GYRUS INSTRUMENTS;  Surgeon: Franchot Gallo, MD;  Location: [facility];  Service: Urology;  Laterality: N/A;  . Cystoscopy with litholapaxy N/A 01/06/2016    Procedure: CYSTOSCOPY WITH LITHOLAPAXY WITH Jobe Gibbon;  Surgeon: Franchot Gallo, MD;  Location: [facility];  Service: Urology;  Laterality: N/A;   Social History:  Social History   Social History Narrative  No tobacco or illicit drug use.  He has a glass of wine with dinner most nights.  No history of EtOH dependence or withdrawal.  He is married.  He has three adult children.  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  No Known Allergies  Family History  Problem Relation Age of Onset  . Colon cancer Mother   . Pancreatic cancer Mother   . Heart failure Father   . Diabetes Sister    Prior to Admission medications   Medication Sig Start Date End Date Taking? Authorizing Provider  amLODipine (NORVASC) 5 MG tablet Take 5 mg by mouth every morning.   Yes Historical Provider, MD  carvedilol (COREG) 3.125 MG tablet Take 3.125 mg by mouth 2 (two) times daily.   Yes  Historical Provider, MD  Cholecalciferol (VITAMIN D) 2000 units CAPS Take 1 capsule by mouth 2 (two) times daily.   Yes Historical Provider, MD  CINNAMON PO Take 1,000 mg by mouth 2 (two) times daily.   Yes Historical Provider, MD  glimepiride (AMARYL) 4 MG tablet Take 4 mg by mouth 2 (two) times daily.   Yes Historical Provider, MD  metFORMIN (GLUCOPHAGE) 500 MG tablet Take 500 mg by mouth at bedtime.   Yes Historical Provider, MD  Multiple Vitamin (MULTIVITAMIN WITH MINERALS) TABS tablet Take 1 tablet by mouth every morning.   Yes Historical Provider, MD  Omega-3  Fatty Acids (FISH OIL) 1200 MG CAPS Take 1 capsule by mouth every morning.   Yes Historical Provider, MD  pioglitazone (ACTOS) 30 MG tablet Take 30 mg by mouth at bedtime.   Yes Historical Provider, MD  simvastatin (ZOCOR) 20 MG tablet Take 20 mg by mouth at bedtime.   Yes Historical Provider, MD  sulfamethoxazole-trimethoprim (BACTRIM DS,SEPTRA DS) 800-160 MG tablet Take 1 tablet by mouth 2 (two) times daily. 01/06/16  Yes Franchot Gallo, MD   Physical Exam: Filed Vitals:   01/09/16 2200 01/09/16 2230 01/09/16 2304 01/09/16 2308  BP: 122/51 115/56 103/57   Pulse: 109 105 101   Temp:    102.8 F (39.3 C)  TempSrc:    Rectal  Resp: 17 20 20    Height:      Weight:      SpO2: 96% 96% 96%      General:  Awake and alert.  Oriented to person, place, time and situation.  NAD.  Very pleasant and nontoxic appearing.  Head: [place]/AT  Eyes: PERRL bilaterally, conjunctiva are pink.  EOMI.  ENT: Mucous membranes are moist.  No nasal drainage.  Neck is supple.  Cardiovascular: Tachycardic but regular.  No LE edema.  Respiratory: CTA bilaterally.  GI: Abdomen is soft/NT/ND.  Bowel sounds are present.  No guarding.  Skin: Warm and dry.  Musculoskeletal: Moves all four extremities spontaneously.  Psychiatric: Normal affect.  Neurologic: No focal deficits.  GU: gross hematuria noted; foley catheter in place  Labs on Admission:  Basic Metabolic Panel:  Recent Labs Lab 01/05/16 0920 01/06/16 1735 01/07/16 0505 01/09/16 2051  NA 143 138 137 139  K 4.4 5.2* 5.2* 4.7  CL 110 108 109 109  CO2 24 23 21* 22  GLUCOSE 127* 193* 362* 177*  BUN 31* 31* 41* 38*  CREATININE 1.27* 1.50* 1.76* 1.90*  CALCIUM 9.6 8.4* 8.0* 9.0   Liver Function Tests: CBC:  Recent Labs Lab 01/05/16 0920 01/06/16 2313 01/07/16 0505 01/07/16 1328 01/08/16 0539 01/09/16 2044  WBC 3.9*  --   --   --   --  2.6*  NEUTROABS  --   --   --   --   --  2.3  HGB 13.0 8.5* 7.8* 7.0* 8.5* 8.4*  HCT 39.7 24.5*  23.0* 20.1* 23.5* 24.7*  MCV 95.0  --   --   --   --  93.2  PLT 137*  --   --   --   --  99*   CBG:  Recent Labs Lab 01/07/16 1129 01/07/16 1709 01/07/16 2138 01/08/16 0725 01/09/16 2055  GLUCAP 246* 235* 175* 162* 157*    Radiological Exams on Admission: Dg Chest 2 View  01/09/2016  CLINICAL DATA:  Unable to urinate since 12:30. Vomiting all day. Trans urethral resection of the prostate done 3 days ago. EXAM: CHEST  2 VIEW COMPARISON:  11/10/2010 FINDINGS: The heart size and mediastinal contours are within normal limits. Both lungs are clear. The visualized skeletal structures are unremarkable. IMPRESSION: No active cardiopulmonary disease. Electronically Signed   By: Andreas Newport [name]   On: 01/09/2016 21:35   U/A shows moderate leukocytes, TNTC WBC and RBC  EKG: Independently reviewed. Sinus tachycardia.  No acute ST segment changes.  Assessment/Plan Principal Problem:   Sepsis ([place]) Active Problems:   Postoperative anemia due to acute blood loss   UTI (lower urinary tract infection)   Hematuria   AKI (acute kidney injury) ([place])   Admit to telemetry  Sepsis with fever, tachycardia, and evidence of AKI with abnormal U/A suggestive of infection.  No evidence of septic shock at this point.  With recent hospitalization, recent surgery, and recurrent foley placement, he is certainly at risk for hospital acquired infection. --Continue empiric Cefepime as started in the ED --IV fluid resuscitation with NS per protocol then maintenance rate of 75cc/hr --No evidence of pneumonia on chest xray; blood and urine cultures are pending --Follow lactic acid level trend; first one elevated at 2.3 --Will check procalcitonin level as well --WBC and platelet counts are low (new finding); will repeat CBC in the AM to look for trend.  Currently avoiding anticoagulants due to active blood loss (hematuria). --I asked the ED physician to notify Urology of this repeat  admission  HTN --Continue home medications with hold parameters  DM --Hold oral medications.  POC glucose testing AC/HS with SSI coverage  ABLA --Monitor for transfusion requirement, particularly if he becomes hemodynamically unstable.  Code Status: FULL Family Communication: Wife at bedside Disposition Plan: Expect him to be here at least two midnights  Time spent: 90 minutes  [employer] [facility]  01/09/2016, 11:27 PM

## 2016-01-09 NOTE — ED Notes (Signed)
Patient has not been able to void since 12:30 today. Patient stated he had a turp done Thursday.

## 2016-01-09 NOTE — Progress Notes (Addendum)
Pharmacy Antibiotic Note  Kevin Clements is a 75 y.o. male admitted on 01/09/2016 with UTI/ sepsis.  Discharged 4/8 after undergoing TURP.  Returns to ED with c/o urinary retention.   Tmax = 103.8  Pharmacy has been consulted for Cefepime & Vancomycin dosing. Patient received Cefepime 2gm IV x 1 in ED.    Plan: Cefepime 1gm IV q24h Vancomycin 1250mg  IV q24h F/u cultures  Height: 6\' 1"  (185.4 cm) Weight: 210 lb (95.255 kg) IBW/kg (Calculated) : 79.9  Temp (24hrs), Avg:101 F (38.3 C), Min:98.2 F (36.8 C), Max:103.8 F (39.9 C)   Recent Labs Lab 01/05/16 0920 01/06/16 1735 01/07/16 0505 01/09/16 2057  WBC 3.9*  --   --   --   CREATININE 1.27* 1.50* 1.76*  --   LATICACIDVEN  --   --   --  2.33*    Estimated Creatinine Clearance: 42.9 mL/min (by C-G formula based on Cr of 1.76).    4/10 CrCl (n) = 35 ml/min  No Known Allergies  Antimicrobials this admission: 4/9 Cefepime >>   4/10 Vanc >>  Dose adjustments this admission:    Microbiology results: 4/9 BCx:   4/10 Urine Cx:  Thank you for allowing pharmacy to be a part of this patient's care.  Everette Rank, PharmD 01/09/2016 9:23 PM

## 2016-01-10 DIAGNOSIS — A4151 Sepsis due to Escherichia coli [E. coli]: Secondary | ICD-10-CM

## 2016-01-10 LAB — COMPREHENSIVE METABOLIC PANEL
ALT: 20 U/L (ref 17–63)
ANION GAP: 9 (ref 5–15)
AST: 27 U/L (ref 15–41)
Albumin: 3.1 g/dL — ABNORMAL LOW (ref 3.5–5.0)
Alkaline Phosphatase: 25 U/L — ABNORMAL LOW (ref 38–126)
BILIRUBIN TOTAL: 0.3 mg/dL (ref 0.3–1.2)
BUN: 33 mg/dL — AB (ref 6–20)
CHLORIDE: 112 mmol/L — AB (ref 101–111)
CO2: 19 mmol/L — ABNORMAL LOW (ref 22–32)
Calcium: 7.9 mg/dL — ABNORMAL LOW (ref 8.9–10.3)
Creatinine, Ser: 1.76 mg/dL — ABNORMAL HIGH (ref 0.61–1.24)
GFR calc Af Amer: 43 mL/min — ABNORMAL LOW (ref >60)
GFR, EST NON AFRICAN AMERICAN: 37 mL/min — AB (ref >60)
GLUCOSE: 158 mg/dL — AB (ref 65–99)
POTASSIUM: 4.1 mmol/L (ref 3.5–5.1)
Sodium: 140 mmol/L (ref 135–145)
TOTAL PROTEIN: 5.4 g/dL — AB (ref 6.5–8.1)

## 2016-01-10 LAB — TYPE AND SCREEN
ABO/RH(D): A POS
Antibody Screen: NEGATIVE
UNIT DIVISION: 0
Unit division: 0

## 2016-01-10 LAB — GLUCOSE, CAPILLARY
Glucose-Capillary: 123 mg/dL — ABNORMAL HIGH (ref 65–99)
Glucose-Capillary: 82 mg/dL (ref 65–99)
Glucose-Capillary: 65 mg/dL (ref 65–99)
Glucose-Capillary: 83 mg/dL (ref 65–99)
Glucose-Capillary: 89 mg/dL (ref 65–99)

## 2016-01-10 LAB — HEMOGLOBIN AND HEMATOCRIT, BLOOD
HEMATOCRIT: 22.4 % — AB (ref 39.0–52.0)
HEMOGLOBIN: 7.7 g/dL — AB (ref 13.0–17.0)

## 2016-01-10 LAB — CBC WITH DIFFERENTIAL/PLATELET
BASOS ABS: 0 10*3/uL (ref 0.0–0.1)
Basophils Relative: 0 %
EOS PCT: 0 %
Eosinophils Absolute: 0 10*3/uL (ref 0.0–0.7)
HEMATOCRIT: 21.5 % — AB (ref 39.0–52.0)
HEMOGLOBIN: 7.4 g/dL — AB (ref 13.0–17.0)
LYMPHS ABS: 0.3 10*3/uL — AB (ref 0.7–4.0)
LYMPHS PCT: 3 %
MCH: 32.2 pg (ref 26.0–34.0)
MCHC: 34.4 g/dL (ref 30.0–36.0)
MCV: 93.5 fL (ref 78.0–100.0)
MONOS PCT: 4 %
Monocytes Absolute: 0.3 10*3/uL (ref 0.1–1.0)
Neutro Abs: 8.1 10*3/uL — ABNORMAL HIGH (ref 1.7–7.7)
Neutrophils Relative %: 93 %
Platelets: 81 10*3/uL — ABNORMAL LOW (ref 150–400)
RBC: 2.3 MIL/uL — AB (ref 4.22–5.81)
RDW: 15.3 % (ref 11.5–15.5)
WBC: 8.7 10*3/uL (ref 4.0–10.5)

## 2016-01-10 LAB — PROCALCITONIN: Procalcitonin: 5.99 ng/mL

## 2016-01-10 LAB — PROTIME-INR
INR: 1.26 (ref 0.00–1.49)
PROTHROMBIN TIME: 15.5 s — AB (ref 11.6–15.2)

## 2016-01-10 LAB — LACTIC ACID, PLASMA: Lactic Acid, Venous: 3.3 mmol/L (ref 0.5–2.0)

## 2016-01-10 LAB — APTT: APTT: 26 s (ref 24–37)

## 2016-01-10 LAB — PREPARE RBC (CROSSMATCH)

## 2016-01-10 MED ORDER — SODIUM CHLORIDE 0.9 % IV BOLUS (SEPSIS)
500.0000 mL | Freq: Once | INTRAVENOUS | Status: AC
  Administered 2016-01-10: 500 mL via INTRAVENOUS

## 2016-01-10 MED ORDER — TAMSULOSIN HCL 0.4 MG PO CAPS
0.4000 mg | ORAL_CAPSULE | Freq: Every day | ORAL | Status: DC
  Administered 2016-01-10 – 2016-01-13 (×4): 0.4 mg via ORAL
  Filled 2016-01-10 (×5): qty 1

## 2016-01-10 MED ORDER — SODIUM CHLORIDE 0.9 % IV SOLN
Freq: Once | INTRAVENOUS | Status: AC
  Administered 2016-01-10: 06:00:00 via INTRAVENOUS

## 2016-01-10 MED ORDER — SODIUM CHLORIDE 0.9 % IV SOLN
Freq: Once | INTRAVENOUS | Status: AC
  Administered 2016-01-10: 16:00:00 via INTRAVENOUS

## 2016-01-10 MED ORDER — DIPHENHYDRAMINE HCL 25 MG PO CAPS: 25.0000 mg | ORAL_CAPSULE | Freq: Once | ORAL | Status: DC

## 2016-01-10 MED ORDER — BELLADONNA ALKALOIDS-OPIUM 16.2-60 MG RE SUPP: 1.0000 | Freq: Three times a day (TID) | RECTAL | Status: DC | PRN

## 2016-01-10 MED ORDER — HYDROCODONE-ACETAMINOPHEN 5-325 MG PO TABS
1.0000 | ORAL_TABLET | Freq: Four times a day (QID) | ORAL | Status: DC | PRN
  Administered 2016-01-10: 1 via ORAL
  Filled 2016-01-10: qty 1

## 2016-01-10 MED ORDER — ACETAMINOPHEN 325 MG PO TABS
650.0000 mg | ORAL_TABLET | Freq: Once | ORAL | Status: AC
  Administered 2016-01-10: 650 mg via ORAL
  Filled 2016-01-10: qty 2

## 2016-01-10 MED ORDER — VANCOMYCIN HCL 10 G IV SOLR
1250.0000 mg | INTRAVENOUS | Status: DC
  Administered 2016-01-10 – 2016-01-12 (×3): 1250 mg via INTRAVENOUS
  Filled 2016-01-10 (×3): qty 1250

## 2016-01-10 MED ORDER — PIPERACILLIN-TAZOBACTAM 3.375 G IVPB
3.3750 g | Freq: Three times a day (TID) | INTRAVENOUS | Status: DC
  Administered 2016-01-10 – 2016-01-11 (×4): 3.375 g via INTRAVENOUS
  Filled 2016-01-10 (×5): qty 50

## 2016-01-10 MED ORDER — ACETAMINOPHEN 650 MG RE SUPP: 650.0000 mg | Freq: Four times a day (QID) | RECTAL | Status: DC | PRN

## 2016-01-10 MED ORDER — ACETAMINOPHEN 325 MG PO TABS
650.0000 mg | ORAL_TABLET | Freq: Four times a day (QID) | ORAL | Status: DC | PRN
  Administered 2016-01-11 – 2016-01-13 (×9): 650 mg via ORAL
  Filled 2016-01-10 (×9): qty 2

## 2016-01-10 MED ORDER — PIPERACILLIN-TAZOBACTAM 3.375 G IVPB
3.3750 g | Freq: Three times a day (TID) | INTRAVENOUS | Status: DC
  Filled 2016-01-10: qty 50

## 2016-01-10 NOTE — Progress Notes (Signed)
Pharmacy Antibiotic Note  Menzo Braam is a 75 y.o. male admitted on 01/09/2016 s/p cystoscopy/TURP (4/6) who presented to on 4/9 with complaint of hematuria and urinary retention.  Vancomycin and cefepime were started on admission for suspected UTI/sepsis.  Per MD request to change cefepime to Zosyn today.  Plan: - Continue vancomycin 1250mg  IV q24h - Zosyn  3.375g q8h (4 hour infusion) - F/u cultures and renal function __________________________  Height: 6\' 1"  (185.4 cm) Weight: 210 lb 8 oz (95.482 kg) IBW/kg (Calculated) : 79.9  Temp (24hrs), Avg:100.4 F (38 C), Min:98.2 F (36.8 C), Max:103.8 F (39.9 C)   Recent Labs Lab 01/05/16 0920 01/06/16 1735 01/07/16 0505 01/09/16 2044 01/09/16 2051 01/09/16 2057 01/09/16 2302 01/10/16 0246  WBC 3.9*  --   --  2.6*  --   --   --  8.7  CREATININE 1.27* 1.50* 1.76*  --  1.90*  --   --  1.76*  LATICACIDVEN  --   --   --   --   --  2.33* 2.58* 3.3*    Estimated Creatinine Clearance: 42.9 mL/min (by C-G formula based on Cr of 1.76).     No Known Allergies  Antimicrobials this admission: Vancomycin 4/10 >>  Cefepime 4/9 >> 4/9 Zosyn 4/10 >>  Dose adjustments this admission: N/A  Microbiology results: 4/9 BCx: pending 4/9 UCx: pending    Thank you for allowing pharmacy to be a part of this patient's care.  Tonette Bihari, PharmD candidate 01/10/2016 8:35 AM

## 2016-01-10 NOTE — Progress Notes (Signed)
[facility] PROGRESS NOTE  Ailton Belville [id number] DOB: 1941/05/17 DOA: 01/09/2016 PCP: Jani Gravel, MD  Length of stay: 1   Assessment/Plan: Principal Problem:   Sepsis ([place]) Active Problems:   Postoperative anemia due to acute blood loss   UTI (lower urinary tract infection)   Hematuria   AKI (acute kidney injury) ([place])   HPI: Kevin Clements is a 75 y.o. 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 He is S/P TURP. His postoperative course was complicated by ABLA; he required 2 units of PRBCs. He was discharged home with a hemoglobin of 8.5 (it was normal on previous admission). Foley catheter was removed prior to discharge. In the past 24 hours, the patient has noted continued hematuria, decreased urine output, and bladder distention. He has had persistent weakness and DOE, which he attributes to his anemia. He has had nausea but no significant vomiting. He is appetite has been normal. No chest pain, light-headedness, headache, or abdominal pain. He presented to the ED because he though he would have a foley catheter placed for the retention and expected to follow-up with his urologist in clinic this week. However, he spiked a fever to 103.8 with associated tachycardia (heart rate 120's). Sepsis protocol was activated. The patient has received empiric Cefepime and aggressive IV fluid resuscitation has been initiated. Blood and urine cultures are pending   Assessment and plan  Sepsis with fever, tachycardia, and evidence of AKI with abnormal U/A suggestive of infection. No evidence of septic shock at this point. With recent hospitalization, recent surgery, and recurrent foley placement, he is certainly at risk for hospital acquired infection. Started on vancomycin, cefepime, cefepime has been switched to Zosyn -Continue-IV  fluid resuscitation with NS per protocol  --No evidence of pneumonia on chest xray; blood and urine cultures are pending --Follow lactic acid level trend; first one elevated at 2.3 Pro calcitonin 6 Leukopenia likely secondary to sepsis, Improved overnight-  Acute kidney injury-baseline 1.27, now 1.76 improved from 1.9 Likely prerenal/postobstructive secondary to hematuria  HTN --Continue home medications with hold parameters  DM --Hold oral medications. POC glucose testing AC/HS with SSI coverage  ABLA Hemoglobin was 13.0 on 4/5, now 7.4 likely secondary to blood loss Patient states that he has not been taking aspirin for the last several days Transfuse with 2 units of packed red blood cells    DVT prophylaxsis scd's   Code Status:      Code Status Orders        Start     Ordered   01/09/16 2340  Full code   Continuous     01/09/16 2339    Family Communication: Discussed in detail with the patient, all imaging results, lab results explained to the patient   Disposition Plan:  Urology consult pending, cont inpatient     Consultants:  urology  Procedures:  None   Antibiotics: Anti-infectives    Start     Dose/Rate Route Frequency Ordered Stop   01/10/16 2200  ceFEPIme (MAXIPIME) 1 g in dextrose 5 % 50 mL IVPB  Status:  Discontinued     1 g 100 mL/hr over 30 Minutes Intravenous Every 24 hours 01/09/16 2128 01/10/16 0821   01/10/16 0930  piperacillin-tazobactam (ZOSYN) IVPB 3.375 g     3.375 g 12.5 mL/hr over 240 Minutes Intravenous Every 8 hours 01/10/16 0902  01/10/16 0300  vancomycin (VANCOCIN) 1,250 mg in sodium chloride 0.9 % 250 mL IVPB     1,250 mg 166.7 mL/hr over 90 Minutes Intravenous Every 24 hours 01/10/16 0235     01/09/16 2115  ceFEPIme (MAXIPIME) 2 g in dextrose 5 % 50 mL IVPB     2 g 100 mL/hr over 30 Minutes Intravenous  Once 01/09/16 2112 01/09/16 2219         HPI/Subjective: Denies any pelvic pain, low grade fever ,ongoing  hematuria   Objective: Filed Vitals:   01/10/16 0159 01/10/16 0623 01/10/16 0741 01/10/16 0759  BP: 100/47  103/48 115/52  Pulse: 103  89 65  Temp: 99 F (37.2 C) 100.5 F (38.1 C) 99.5 F (37.5 C) 99.6 F (37.6 C)  TempSrc: Oral  Oral Oral  Resp:   20 19  Height:      Weight:      SpO2:   97% 98%    Intake/Output Summary (Last 24 hours) at 01/10/16 [id number] Last data filed at 01/09/16 2325  Gross per 24 hour  Intake      0 ml  Output   3100 ml  Net  -3100 ml    Exam:  General: No acute respiratory distress Lungs: Clear to auscultation bilaterally without wheezes or crackles Cardiovascular: Regular rate and rhythm without murmur gallop or rub normal S1 and S2 Abdomen: Nontender, nondistended, soft, bowel sounds positive, no rebound, no ascites, no appreciable mass Extremities: No significant cyanosis, clubbing, or edema bilateral lower extremities     Data Review   Micro Results No results found for this or any previous visit (from the past 240 hour(s)).  Radiology Reports Dg Chest 2 View  01/09/2016  CLINICAL DATA:  Unable to urinate since 12:30. Vomiting all day. Trans urethral resection of the prostate done 3 days ago. EXAM: CHEST  2 VIEW COMPARISON:  11/10/2010 FINDINGS: The heart size and mediastinal contours are within normal limits. Both lungs are clear. The visualized skeletal structures are unremarkable. IMPRESSION: No active cardiopulmonary disease. Electronically Signed   By: Andreas Newport [name]   On: 01/09/2016 21:35     CBC  Recent Labs Lab 01/05/16 0920  01/07/16 0505 01/07/16 1328 01/08/16 0539 01/09/16 2044 01/10/16 0246  WBC 3.9*  --   --   --   --  2.6* 8.7  HGB 13.0  < > 7.8* 7.0* 8.5* 8.4* 7.4*  HCT 39.7  < > 23.0* 20.1* 23.5* 24.7* 21.5*  PLT 137*  --   --   --   --  99* 81*  MCV 95.0  --   --   --   --  93.2 93.5  MCH 31.1  --   --   --   --  31.7 32.2  MCHC 32.7  --   --   --   --  34.0 34.4  RDW 13.7  --   --   --   --  15.4 15.3   LYMPHSABS  --   --   --   --   --  0.3* 0.3*  MONOABS  --   --   --   --   --  0.0* 0.3  EOSABS  --   --   --   --   --  0.0 0.0  BASOSABS  --   --   --   --   --  0.0 0.0  < > = values in this interval not displayed.  Chemistries  Recent Labs Lab 01/05/16 0920 01/06/16 1735 01/07/16 0505 01/09/16 2051 01/10/16 0246  NA 143 138 137 139 140  K 4.4 5.2* 5.2* 4.7 4.1  CL 110 108 109 109 112*  CO2 24 23 21* 22 19*  GLUCOSE 127* 193* 362* 177* 158*  BUN 31* 31* 41* 38* 33*  CREATININE 1.27* 1.50* 1.76* 1.90* 1.76*  CALCIUM 9.6 8.4* 8.0* 9.0 7.9*  AST  --   --   --   --  27  ALT  --   --   --   --  20  ALKPHOS  --   --   --   --  25*  BILITOT  --   --   --   --  0.3   ------------------------------------------------------------------------------------------------------------------ estimated creatinine clearance is 42.9 mL/min (by C-G formula based on Cr of 1.76). ------------------------------------------------------------------------------------------------------------------ No results for input(s): HGBA1C in the last 72 hours. ------------------------------------------------------------------------------------------------------------------ No results for input(s): CHOL, HDL, LDLCALC, TRIG, CHOLHDL, LDLDIRECT in the last 72 hours. ------------------------------------------------------------------------------------------------------------------ No results for input(s): TSH, T4TOTAL, T3FREE, THYROIDAB in the last 72 hours.  Invalid input(s): FREET3 ------------------------------------------------------------------------------------------------------------------ No results for input(s): VITAMINB12, FOLATE, FERRITIN, TIBC, IRON, RETICCTPCT in the last 72 hours.  Coagulation profile  Recent Labs Lab 01/09/16 2351  INR 1.26    No results for input(s): DDIMER in the last 72 hours.  Cardiac Enzymes No results for input(s): CKMB, TROPONINI, MYOGLOBIN in the last 168  hours.  Invalid input(s): CK ------------------------------------------------------------------------------------------------------------------ Invalid input(s): POCBNP   CBG:  Recent Labs Lab 01/07/16 1709 01/07/16 2138 01/08/16 0725 01/09/16 2055 01/10/16 0727  GLUCAP 235* 175* 162* 157* 123*       Studies: Dg Chest 2 View  01/09/2016  CLINICAL DATA:  Unable to urinate since 12:30. Vomiting all day. Trans urethral resection of the prostate done 3 days ago. EXAM: CHEST  2 VIEW COMPARISON:  11/10/2010 FINDINGS: The heart size and mediastinal contours are within normal limits. Both lungs are clear. The visualized skeletal structures are unremarkable. IMPRESSION: No active cardiopulmonary disease. Electronically Signed   By: Andreas Newport [name]   On: 01/09/2016 21:35      Lab Results  Component Value Date   HGBA1C 7.0* 01/05/2016   Lab Results  Component Value Date   CREATININE 1.76* 01/10/2016       Scheduled Meds: . sodium chloride   Intravenous Once  . diphenhydrAMINE  25 mg Oral Once  . insulin aspart  0-9 Units Subcutaneous TID WC  . piperacillin-tazobactam (ZOSYN)  IV  3.375 g Intravenous Q8H  . simvastatin  20 mg Oral QHS  . sodium chloride flush  3 mL Intravenous Q12H  . vancomycin  1,250 mg Intravenous Q24H   Continuous Infusions:   Principal Problem:   Sepsis ([place]) Active Problems:   Postoperative anemia due to acute blood loss   UTI (lower urinary tract infection)   Hematuria   AKI (acute kidney injury) ([place])    Time spent: 45 minutes   [facility] Pager [phone]. If 7PM-7AM, please contact night-coverage at [email or web address], password [facility] 01/10/2016, 9:09 AM  LOS: 1 day

## 2016-01-10 NOTE — Progress Notes (Signed)
CRITICAL VALUE ALERT  Critical value received:  Lactic Acid 3.3  Date of notification:  01/10/2016  Time of notification:  0336  Critical value read back:Yes.    Nurse who received alert:  Joyce Copa, RN  MD notified (1st page):   Rogue Bussing, NP   Time of first page:  [phone]  MD notified (2nd page):  Time of second page:  Responding MD: Rogue Bussing, NP    Time MD responded:   [phone]

## 2016-01-10 NOTE — Consult Note (Signed)
Urology Consult  Consulting CE:[id number] Eulas Post, MD  CC: fever, urinary retention  HPI: This is a 75 year old male who was readmitted to the hospital last night with high fever and urinary retention.  The patient underwent TURP for a very large prostate as well as cystolitholapaxy on 01/06/2016.  Because of significant bleeding during and after the procedure, he received 2 units of packed red blood cells prior to his discharge home on 01/08/2016.  He was doing well until yesterday morning, when he began having difficulty urinating.  He went to the emergency room where a Foley catheter was placed, liberating a few clots.  He then developed a fever of 103.8.  Prior to that, he was having no shakes, chills or dysuria.  The patient was hypotensive in addition to his fever, and received boluses of fluid.  Because of his blood loss anemia, he has been started on transfusion of 2 units of packed red blood cells.  He is admitted for management of possible sepsis and urinary retention.  PMH: Past Medical History  Diagnosis Date  . Hypertension   . Hyperlipidemia   . History of skin cancer   . History of kidney stones   . History of bladder stone   . History of UTI   . BPH (benign prostatic hyperplasia)   . Elevated serum creatinine   . Diabetes mellitus without complication ([place])   . Cancer ([place])     hx skin cancer    PSH: Past Surgical History  Procedure Laterality Date  . Hand surgery  2005 /2011    bilateral  . Cyst removed  2012    from back  . Cystoscopy  2012    to remove kidney stone and bladder stone  . Tonsillectomy    . Transurethral resection of prostate N/A 01/06/2016    Procedure: TRANSURETHRAL RESECTION OF THE PROSTATE WITH GYRUS INSTRUMENTS;  Surgeon: Franchot Gallo, MD;  Location: [facility];  Service: Urology;  Laterality: N/A;  . Cystoscopy with litholapaxy N/A 01/06/2016    Procedure: CYSTOSCOPY WITH LITHOLAPAXY WITH Jobe Gibbon;  Surgeon: Franchot Gallo, MD;  Location: [facility];   Service: Urology;  Laterality: N/A;    Allergies: No Known Allergies  Medications: Prescriptions prior to admission  Medication Sig Dispense Refill Last Dose  . amLODipine (NORVASC) 5 MG tablet Take 5 mg by mouth every morning.   01/09/2016 at Unknown time  . carvedilol (COREG) 3.125 MG tablet Take 3.125 mg by mouth 2 (two) times daily.   01/09/2016 at 0830  . Cholecalciferol (VITAMIN D) 2000 units CAPS Take 1 capsule by mouth 2 (two) times daily.   01/09/2016 at Unknown time  . CINNAMON PO Take 1,000 mg by mouth 2 (two) times daily.   01/09/2016 at Unknown time  . glimepiride (AMARYL) 4 MG tablet Take 4 mg by mouth 2 (two) times daily.   01/09/2016 at Unknown time  . metFORMIN (GLUCOPHAGE) 500 MG tablet Take 500 mg by mouth at bedtime.   01/08/2016 at Unknown time  . Multiple Vitamin (MULTIVITAMIN WITH MINERALS) TABS tablet Take 1 tablet by mouth every morning.   01/09/2016 at Unknown time  . Omega-3 Fatty Acids (FISH OIL) 1200 MG CAPS Take 1 capsule by mouth every morning.   01/09/2016 at Unknown time  . pioglitazone (ACTOS) 30 MG tablet Take 30 mg by mouth at bedtime.   01/08/2016 at Unknown time  . simvastatin (ZOCOR) 20 MG tablet Take 20 mg by mouth at bedtime.   01/08/2016 at Unknown  time  . sulfamethoxazole-trimethoprim (BACTRIM DS,SEPTRA DS) 800-160 MG tablet Take 1 tablet by mouth 2 (two) times daily. 6 tablet 0 01/09/2016 at one dose only     Social History: Social History   Social History  . Marital Status: Married    Spouse Name: N/A  . Number of Children: N/A  . Years of Education: N/A   Occupational History  . Not on file.   Social History Main Topics  . Smoking status: Never Smoker   . Smokeless tobacco: Not on file  . Alcohol Use: Yes     Comment: 1 gl wine x5 per week  . Drug Use: No  . Sexual Activity: Not on file   Other Topics Concern  . Not on file   Social History Narrative    Family History: Family History  Problem Relation Age of Onset  . Colon cancer Mother    . Pancreatic cancer Mother   . Heart failure Father   . Diabetes Sister     Review of Systems: Positive: fever, without shakes or chills.  Difficulty urinating. Negative: .  A further 10 point review of systems was negative except what is listed in the HPI.  Physical Exam: @VITALS2 @ General: No acute distress.  Awake. Head:  Normocephalic.  Atraumatic. ENT:  EOMI.  Mucous membranes moist Neck:  Supple.  No lymphadenopathy. CV:  S1 present. S2 present. Regular rate. Pulmonary: Equal effort bilaterally.  Clear to auscultation bilaterally. Abdomen: Soft.  none tender to palpation. Skin:  Normal turgor.  No visible rash. Extremity: No gross deformity of bilateral upper extremities.  No gross deformity of    bilateral lower extremities. Neurologic: Alert. Appropriate mood.  Penis:  circumcised.  No lesions. Urethra: Foley catheter in place.  Orthotopic meatus. Scrotum: No lesions.  No ecchymosis.  No erythema.   Studies:  Recent Labs     01/09/16  2044  01/10/16  0246  HGB  8.4*  7.4*  WBC  2.6*  8.7  PLT  99*  81*    Recent Labs     01/09/16  2051  01/10/16  0246  NA  139  140  K  4.7  4.1  CL  109  112*  CO2  22  19*  BUN  38*  33*  CREATININE  1.90*  1.76*  CALCIUM  9.0  7.9*  GFRNONAA  34*  37*  GFRAA  39*  43*     Recent Labs     01/09/16  2351  INR  1.26  APTT  26     Invalid input(s): ABG    Assessment:  1.  Bladder calculi, status post cystolitholapaxy on 01/06/2016  2.  BPH with very large gland, status post significant volume TURP on 01/06/2016 with need for transfusion postoperatively due to blood loss anemia  3.  Recent fever, possible sepsis.  Prior to the procedure, the patient was treated for a sensitive enterococcus for urinary tract infection with a week of nitrofurantoin.  Plan: I agree with broad-spectrum antibiotic coverage  I irrigated the patient's catheter today.  Despite it only being a 27 French Foley catheter, I think it is  evacuating his bladder adequately  I agree with his transfusion  I will start him on Flomax to assist with bladder emptying once his Foley comes out-I would not recommend doing that for couple of days    [phone]

## 2016-01-10 NOTE — Progress Notes (Signed)
Page received from RN for request for bladder irrigation because the patient continues to have significant hematuria and is passing blood clots.  Further questioning and chart review revealed relative hypotension, persistent fever, persistent lactic acidosis, and markedly elevated procalcitonin level to 6.  Orders submitted for STAT labs including Type and screen with crossmatch, transfusion of 2 units of PRBCs (I called the lab for STAT request), repeat H/H, and lactic acid level with morning labs.  I am also adding vancomycin to his empiric coverage given his recent hospitalization, surgery, and repeat foley catheter insertion.  We will hold all home anti-hypertensives for now.  Low threshold for ICU transfer if he decompensates.

## 2016-01-10 NOTE — Progress Notes (Signed)
Hand Irrigated pt x3. Clear urine returned each time. 120 mL irrigated in, 125 mL clear urine out. Will continue to monitor pt closely. Kevin Clements

## 2016-01-11 DIAGNOSIS — N401 Enlarged prostate with lower urinary tract symptoms: Secondary | ICD-10-CM

## 2016-01-11 DIAGNOSIS — I10 Essential (primary) hypertension: Secondary | ICD-10-CM

## 2016-01-11 DIAGNOSIS — N183 Chronic kidney disease, stage 3 (moderate): Secondary | ICD-10-CM

## 2016-01-11 DIAGNOSIS — N138 Other obstructive and reflux uropathy: Secondary | ICD-10-CM

## 2016-01-11 DIAGNOSIS — Z960 Presence of urogenital implants: Secondary | ICD-10-CM

## 2016-01-11 DIAGNOSIS — E1121 Type 2 diabetes mellitus with diabetic nephropathy: Secondary | ICD-10-CM

## 2016-01-11 LAB — COMPREHENSIVE METABOLIC PANEL
ALT: 27 U/L (ref 17–63)
AST: 37 U/L (ref 15–41)
Albumin: 2.6 g/dL — ABNORMAL LOW (ref 3.5–5.0)
Alkaline Phosphatase: 25 U/L — ABNORMAL LOW (ref 38–126)
Anion gap: 6 (ref 5–15)
BUN: 25 mg/dL — AB (ref 6–20)
CHLORIDE: 113 mmol/L — AB (ref 101–111)
CO2: 20 mmol/L — ABNORMAL LOW (ref 22–32)
CREATININE: 1.39 mg/dL — AB (ref 0.61–1.24)
Calcium: 7.6 mg/dL — ABNORMAL LOW (ref 8.9–10.3)
GFR calc Af Amer: 57 mL/min — ABNORMAL LOW (ref >60)
GFR, EST NON AFRICAN AMERICAN: 49 mL/min — AB (ref >60)
GLUCOSE: 83 mg/dL (ref 65–99)
Potassium: 3.9 mmol/L (ref 3.5–5.1)
Sodium: 139 mmol/L (ref 135–145)
Total Bilirubin: 0.5 mg/dL (ref 0.3–1.2)
Total Protein: 5 g/dL — ABNORMAL LOW (ref 6.5–8.1)

## 2016-01-11 LAB — CBC
HEMATOCRIT: 21.5 % — AB (ref 39.0–52.0)
Hemoglobin: 7.6 g/dL — ABNORMAL LOW (ref 13.0–17.0)
MCH: 31.8 pg (ref 26.0–34.0)
MCHC: 35.3 g/dL (ref 30.0–36.0)
MCV: 90 fL (ref 78.0–100.0)
Platelets: 67 10*3/uL — ABNORMAL LOW (ref 150–400)
RBC: 2.39 MIL/uL — AB (ref 4.22–5.81)
RDW: 16.3 % — ABNORMAL HIGH (ref 11.5–15.5)
WBC: 6 10*3/uL (ref 4.0–10.5)

## 2016-01-11 LAB — GLUCOSE, CAPILLARY
GLUCOSE-CAPILLARY: 99 mg/dL (ref 65–99)
GLUCOSE-CAPILLARY: 96 mg/dL (ref 65–99)
GLUCOSE-CAPILLARY: 147 mg/dL — AB (ref 65–99)
GLUCOSE-CAPILLARY: 163 mg/dL — AB (ref 65–99)

## 2016-01-11 LAB — HEMOGLOBIN AND HEMATOCRIT, BLOOD
HCT: 24.7 % — ABNORMAL LOW (ref 39.0–52.0)
HEMATOCRIT: 23.5 % — AB (ref 39.0–52.0)
Hemoglobin: 8.4 g/dL — ABNORMAL LOW (ref 13.0–17.0)
Hemoglobin: 8 g/dL — ABNORMAL LOW (ref 13.0–17.0)

## 2016-01-11 LAB — LACTIC ACID, PLASMA: LACTIC ACID, VENOUS: 1.1 mmol/L (ref 0.5–2.0)

## 2016-01-11 NOTE — Progress Notes (Signed)
[facility] PROGRESS NOTE  Kevin Clements [id number] DOB: 1941/06/29 DOA: 01/09/2016 PCP: Jani Gravel, MD  Assessment/Plan: 1-sepsis: due to UTI most likely -sepsis features improving  -continue current antibiotics -follow culture data -continue supportive care and will follow clinical response -lactic acid level WNL now  2-essential HTN -stable, but soft -will continue holding coreg and norvasc for now  3-HLD -continue HLD  4-diabetes type 2: not on insulin at home -will continue SSI while inpatient -holding oral hypoglycemic agents   5-acute renal failure on CKD stage 3 at baseline -continue IVF's -secondary to UTI, dehydration and poor perfusion with anemia -follow renal function trend -minimize/avoid use of nephrotoxic agents   6-acute blood loss anemia -status post 2 units PRBc's during this admission -Hgb 8.4 today -will follow trend and transfuse as needed   Code Status: Full Family Communication: wife at bedside  Disposition Plan: planning voiding trial in am; patient improving and doing better. No CP or SOB. Home when medically stable and w/o fever.   Consultants:  Urology   Procedures: TURP and cystolithalopaxy.  Antibiotics:  Vancomycin and Zosyn 01/10/16  HPI/Subjective: Afebrile, no CP, no SOB. Still with high grade fever overnight. No nausea, no vomiting and with good urine output.  Objective: Filed Vitals:   01/10/16 2058 01/11/16 0551  BP: 121/57 109/51  Pulse: 82 77  Temp: 99.2 F (37.3 C) 98.4 F (36.9 C)  Resp: 18 20    Intake/Output Summary (Last 24 hours) at 01/11/16 1442 Last data filed at 01/11/16 1040  Gross per 24 hour  Intake   1405 ml  Output   3400 ml  Net  -1995 ml   Filed Weights   01/09/16 1932 01/10/16 0030  Weight: 95.255 kg (210 lb) 95.482 kg (210 lb 8 oz)    Exam:   General:  Afebrile currently, no CP or SOB. Feeling much better. Urine is clear and patient denies flank pain.  Cardiovascular: S1  and S2, no rubs or gallops  Respiratory: good air movement, no wheezing or crackles   Abdomen: soft, NT, no distended, positive BS  Musculoskeletal: no edema, no cyanosis   Data Reviewed: Basic Metabolic Panel:  Recent Labs Lab 01/06/16 1735 01/07/16 0505 01/09/16 2051 01/10/16 0246 01/11/16 0503  NA 138 137 139 140 139  K 5.2* 5.2* 4.7 4.1 3.9  CL 108 109 109 112* 113*  CO2 23 21* 22 19* 20*  GLUCOSE 193* 362* 177* 158* 83  BUN 31* 41* 38* 33* 25*  CREATININE 1.50* 1.76* 1.90* 1.76* 1.39*  CALCIUM 8.4* 8.0* 9.0 7.9* 7.6*   Liver Function Tests:  Recent Labs Lab 01/10/16 0246 01/11/16 0503  AST 27 37  ALT 20 27  ALKPHOS 25* 25*  BILITOT 0.3 0.5  PROT 5.4* 5.0*  ALBUMIN 3.1* 2.6*   CBC:  Recent Labs Lab 01/05/16 0920  01/09/16 2044 01/10/16 0246 01/10/16 1842 01/11/16 0503 01/11/16 0827  WBC 3.9*  --  2.6* 8.7  --  6.0  --   NEUTROABS  --   --  2.3 8.1*  --   --   --   HGB 13.0  < > 8.4* 7.4* 7.7* 7.6* 8.4*  HCT 39.7  < > 24.7* 21.5* 22.4* 21.5* 24.7*  MCV 95.0  --  93.2 93.5  --  90.0  --   PLT 137*  --  99* 81*  --  67*  --   < > = values in this interval not displayed.  ProBNP (last 3 results) No  results for input(s): PROBNP in the last 8760 hours.  CBG:  Recent Labs Lab 01/10/16 1609 01/10/16 1643 01/10/16 2109 01/11/16 0737 01/11/16 1155  GLUCAP 65 83 89 99 96    Recent Results (from the past 240 hour(s))  Urine culture     Status: None (Preliminary result)   Collection Time: 01/09/16  7:57 PM  Result Value Ref Range Status   Specimen Description URINE, RANDOM  Final   Special Requests NONE  Final   Culture   Final    CULTURE REINCUBATED FOR BETTER GROWTH Performed at [facility]    Report Status PENDING  Incomplete  Culture, blood (Routine X 2) w Reflex to ID Panel     Status: Abnormal (Preliminary result)   Collection Time: 01/09/16  8:40 PM  Result Value Ref Range Status   Specimen Description BLOOD RIGHT  ANTECUBITAL  Final   Special Requests BOTTLES DRAWN AEROBIC AND ANAEROBIC 6CC  Final   Culture  Setup Time   Final    GRAM POSITIVE COCCI IN PAIRS IN CHAINS AEROBIC BOTTLE ONLY CRITICAL RESULT CALLED TO, READ BACK BY AND VERIFIED WITH: E HABIB,RN AT 1508 01/10/16 BY L BENFIELD    Culture (A)  Final    ENTEROCOCCUS SPECIES SUSCEPTIBILITIES TO FOLLOW Performed at [facility]    Report Status PENDING  Incomplete  Culture, blood (Routine X 2) w Reflex to ID Panel     Status: Abnormal (Preliminary result)   Collection Time: 01/09/16  9:00 PM  Result Value Ref Range Status   Specimen Description BLOOD BLOOD RIGHT FOREARM  Final   Special Requests BOTTLES DRAWN AEROBIC AND ANAEROBIC 6CC  Final   Culture  Setup Time   Final    GRAM POSITIVE COCCI IN PAIRS IN CHAINS IN BOTH AEROBIC AND ANAEROBIC BOTTLES CRITICAL RESULT CALLED TO, READ BACK BY AND VERIFIED WITH: E HABIB,RN AT 1508 01/10/16 BY L BENFIELD    Culture (A)  Final    ENTEROCOCCUS SPECIES SUSCEPTIBILITIES TO FOLLOW Performed at [facility]    Report Status PENDING  Incomplete     Studies: Dg Chest 2 View  01/09/2016  CLINICAL DATA:  Unable to urinate since 12:30. Vomiting all day. Trans urethral resection of the prostate done 3 days ago. EXAM: CHEST  2 VIEW COMPARISON:  11/10/2010 FINDINGS: The heart size and mediastinal contours are within normal limits. Both lungs are clear. The visualized skeletal structures are unremarkable. IMPRESSION: No active cardiopulmonary disease. Electronically Signed   By: Andreas Newport [name]   On: 01/09/2016 21:35    Scheduled Meds: . diphenhydrAMINE  25 mg Oral Once  . insulin aspart  0-9 Units Subcutaneous TID WC  . piperacillin-tazobactam (ZOSYN)  IV  3.375 g Intravenous Q8H  . simvastatin  20 mg Oral QHS  . sodium chloride flush  3 mL Intravenous Q12H  . tamsulosin  0.4 mg Oral Daily  . vancomycin  1,250 mg Intravenous Q24H   Continuous Infusions:   Principal Problem:    Sepsis ([place]) Active Problems:   Postoperative anemia due to acute blood loss   UTI (lower urinary tract infection)   Hematuria   AKI (acute kidney injury) ([place])    Time spent: 35 minutes    Barton Dubois  [facility] Pager [phone]. If 7PM-7AM, please contact night-coverage at [email or web address], password [facility] 01/11/2016, 2:42 PM  LOS: 2 days

## 2016-01-11 NOTE — Progress Notes (Signed)
Pharmacy Antibiotic Note  Kevin Clements is a 75 y.o. male admitted on 01/09/2016 s/p cystoscopy/TURP (4/6) who presented to on 4/9 with complaint of hematuria and urinary retention.  2/2 blood cultures with GPC in pairs and chains. Patient's currently on day #2 of abx with  zosyn and vancomycin.   - Tmax 102.8 wbc wnl, scr trending down 1.39 (crcl~54)   Plan: - Continue vancomycin 1250mg  IV q24h.  With changing renal function, will check vancomycin trough level with AM dose on 4/12 to assess current regimen - continue Zosyn  3.375g q8h (4 hour infusion) - F/u cultures and renal function __________________________  Height: 6\' 1"  (185.4 cm) Weight: 210 lb 8 oz (95.482 kg) IBW/kg (Calculated) : 79.9  Temp (24hrs), Avg:100.4 F (38 C), Min:98.4 F (36.9 C), Max:102.8 F (39.3 C)   Recent Labs Lab 01/05/16 0920 01/06/16 1735 01/07/16 0505 01/09/16 2044 01/09/16 2051 01/09/16 2057 01/09/16 2302 01/10/16 0246 01/11/16 0503  WBC 3.9*  --   --  2.6*  --   --   --  8.7 6.0  CREATININE 1.27* 1.50* 1.76*  --  1.90*  --   --  1.76* 1.39*  LATICACIDVEN  --   --   --   --   --  2.33* 2.58* 3.3*  --     Estimated Creatinine Clearance: 54.3 mL/min (by C-G formula based on Cr of 1.39).     No Known Allergies  Antimicrobials this admission: 4/9 Cefepime >>  4/10 4/10 Vanc >> 4/10 zosyn>>  Dose adjustments this admission: n/a  Microbiology results: 4/9 BCx x2:  2/2 GPC in pairs and chains 4/10 Urine Cx:   Thank you for allowing pharmacy to be a part of this patient's care.  Dia Sitter, PharmD, BCPS 01/11/2016 8:18 AM

## 2016-01-11 NOTE — Progress Notes (Signed)
Patient ID: Kevin Clements, male   DOB: 03-Sep-1941, 75 y.o.   MRN: [id number]    Subjective: Kevin Clements was readmitted with clot retention and sepsis.   He is feeling much better today and the foley is draining well with clear urine.  He is afebrile.   He is thrombocytopenic.  ROS:  Review of Systems  Constitutional: Negative for fever and chills.  Gastrointestinal: Negative for abdominal pain.  Musculoskeletal:       He has some bilateral hip pain.     Anti-infectives: Anti-infectives    Start     Dose/Rate Route Frequency Ordered Stop   01/10/16 2200  ceFEPIme (MAXIPIME) 1 g in dextrose 5 % 50 mL IVPB  Status:  Discontinued     1 g 100 mL/hr over 30 Minutes Intravenous Every 24 hours 01/09/16 2128 01/10/16 0821   01/10/16 1130  piperacillin-tazobactam (ZOSYN) IVPB 3.375 g     3.375 g 12.5 mL/hr over 240 Minutes Intravenous Every 8 hours 01/10/16 1119     01/10/16 0930  piperacillin-tazobactam (ZOSYN) IVPB 3.375 g  Status:  Discontinued     3.375 g 12.5 mL/hr over 240 Minutes Intravenous Every 8 hours 01/10/16 0902 01/10/16 1119   01/10/16 0300  vancomycin (VANCOCIN) 1,250 mg in sodium chloride 0.9 % 250 mL IVPB     1,250 mg 166.7 mL/hr over 90 Minutes Intravenous Every 24 hours 01/10/16 0235     01/09/16 2115  ceFEPIme (MAXIPIME) 2 g in dextrose 5 % 50 mL IVPB     2 g 100 mL/hr over 30 Minutes Intravenous  Once 01/09/16 2112 01/09/16 2219      Current Facility-Administered Medications  Medication Dose Route Frequency Provider Last Rate Last Dose  . acetaminophen (TYLENOL) tablet 650 mg  650 mg Oral Q6H PRN Franchot Gallo, MD   650 mg at 01/11/16 0746   Or  . acetaminophen (TYLENOL) suppository 650 mg  650 mg Rectal Q6H PRN Franchot Gallo, MD      . diphenhydrAMINE (BENADRYL) capsule 25 mg  25 mg Oral Once Lily Kocher, MD   25 mg at 01/10/16 0215  . HYDROcodone-acetaminophen (NORCO/VICODIN) 5-325 MG per tablet 1-2 tablet  1-2 tablet Oral Q6H PRN Jeryl Columbia, NP    1 tablet at 01/10/16 2235  . insulin aspart (novoLOG) injection 0-9 Units  0-9 Units Subcutaneous TID WC Lily Kocher, MD   1 Units at 01/10/16 [phone]  . ondansetron (ZOFRAN) tablet 4 mg  4 mg Oral Q6H PRN Lily Kocher, MD       Or  . ondansetron [facility]) injection 4 mg  4 mg Intravenous Q6H PRN Lily Kocher, MD      . opium-belladonna (B&O SUPPRETTES) 16.2-60 MG suppository 1 suppository  1 suppository Rectal Q8H PRN Lily Kocher, MD      . piperacillin-tazobactam (ZOSYN) IVPB 3.375 g  3.375 g Intravenous Q8H Anh P Pham, RPH   3.375 g at 01/11/16 0335  . simvastatin (ZOCOR) tablet 20 mg  20 mg Oral QHS Lily Kocher, MD   20 mg at 01/10/16 2134  . sodium chloride flush (NS) 0.9 % injection 3 mL  3 mL Intravenous Q12H Lily Kocher, MD   3 mL at 01/10/16 [id number]  . tamsulosin (FLOMAX) capsule 0.4 mg  0.4 mg Oral Daily Franchot Gallo, MD   0.4 mg at 01/10/16 1241  . vancomycin (VANCOCIN) 1,250 mg in sodium chloride 0.9 % 250 mL IVPB  1,250 mg Intravenous Q24H Leann T Poindexter, RPH   1,250  mg at 01/11/16 0335     Objective: Vital signs in last 24 hours: Temp:  [98.4 F (36.9 C)-102.8 F (39.3 C)] 98.4 F (36.9 C) (04/11 0551) Pulse Rate:  [77-96] 77 (04/11 0551) Resp:  [14-20] 20 (04/11 0551) BP: (109-130)/(51-60) 109/51 mmHg (04/11 0551) SpO2:  [96 %-100 %] 96 % (04/11 0551)  Intake/Output from previous day: 04/10 0701 - 04/11 0700 In: 1500 [Blood:670; IV Piggyback:650] Out: 2750 [Urine:2750] Intake/Output this shift:     Physical Exam  Constitutional: He is well-developed, well-nourished, and in no distress.  Genitourinary:  Urine clear in foley bag.    Lab Results:   Recent Labs  01/10/16 0246  01/11/16 0503 01/11/16 0827  WBC 8.7  --  6.0  --   HGB 7.4*  < > 7.6* 8.4*  HCT 21.5*  < > 21.5* 24.7*  PLT 81*  --  67*  --   < > = values in this interval not displayed. BMET  Recent Labs  01/10/16 0246 01/11/16 0503  NA 140 139  K 4.1 3.9  CL 112* 113*  CO2 19*  20*  GLUCOSE 158* 83  BUN 33* 25*  CREATININE 1.76* 1.39*  CALCIUM 7.9* 7.6*   PT/INR  Recent Labs  01/09/16 2351  LABPROT 15.5*  INR 1.26   ABG No results for input(s): PHART, HCO3 in the last 72 hours.  Invalid input(s): PCO2, PO2  Studies/Results: Dg Chest 2 View  01/09/2016  CLINICAL DATA:  Unable to urinate since 12:30. Vomiting all day. Trans urethral resection of the prostate done 3 days ago. EXAM: CHEST  2 VIEW COMPARISON:  11/10/2010 FINDINGS: The heart size and mediastinal contours are within normal limits. Both lungs are clear. The visualized skeletal structures are unremarkable. IMPRESSION: No active cardiopulmonary disease. Electronically Signed   By: Andreas Newport [name]   On: 01/09/2016 21:35     Assessment and Plan: Clot retention with urosepsis following TURP and cystolithalopaxy.  He is improving on current therapy.  Consider voiding trial in am.       LOS: 2 days    [name] 01/11/2016 [id number]

## 2016-01-11 NOTE — Consult Note (Signed)
[facility] for Infectious Disease       Reason for Consult: Enterococcal bacteremia    Referring Physician: CHAMP autoconsult  Principal Problem:   Sepsis ([place]) Active Problems:   Postoperative anemia due to acute blood loss   UTI (lower urinary tract infection)   Hematuria   AKI (acute kidney injury) ([place])   . diphenhydrAMINE  25 mg Oral Once  . insulin aspart  0-9 Units Subcutaneous TID WC  . piperacillin-tazobactam (ZOSYN)  IV  3.375 g Intravenous Q8H  . simvastatin  20 mg Oral QHS  . sodium chloride flush  3 mL Intravenous Q12H  . tamsulosin  0.4 mg Oral Daily  . vancomycin  1,250 mg Intravenous Q24H    Recommendations: Continue with vancomycin Can narrow to ampicillin if sensitive Repeat blood cultures to assure clearance TTE  Assessment: He has Enterococcal bacteria in 2/2 blood cultures with presenting symptoms of sepsis, lactic acidosis, leukocytes in urine with BPH c/w urinary source.  Endocarditis much less likely unless repeat blood cultures positive again, but will check a TTE  Antibiotics: Vancomycin and zosyn  HPI: Kevin Clements is a 75 y.o. male with BPH with urinary obstruction, s/p TURP, foley placement in recent hospitalization who presented 1 day after discharge from urology service with fever to 103.8, tachycardia, noted lactic acidosis and admitted for sepsis.  Blood cultures now with Enterococcus.  Better since admission.   CXR independently reviewed and clear.    Review of Systems:  Constitutional: negative for fevers and chills Gastrointestinal: negative for nausea and diarrhea All other systems reviewed and are negative   Past Medical History  Diagnosis Date  . Hypertension   . Hyperlipidemia   . History of skin cancer   . History of kidney stones   . History of bladder stone   . History of UTI   . BPH (benign prostatic hyperplasia)   . Elevated serum creatinine   . Diabetes mellitus without complication ([place])   . Cancer  ([place])     hx skin cancer    Social History  Substance Use Topics  . Smoking status: Never Smoker   . Smokeless tobacco: None  . Alcohol Use: Yes     Comment: 1 gl wine x5 per week    Family History  Problem Relation Age of Onset  . Colon cancer Mother   . Pancreatic cancer Mother   . Heart failure Father   . Diabetes Sister     No Known Allergies  Physical Exam: Constitutional: in no apparent distress and oriented times 3  Filed Vitals:   01/11/16 0551 01/11/16 1554  BP: 109/51 122/58  Pulse: 77 76  Temp: 98.4 F (36.9 C) 98.4 F (36.9 C)  Resp: 20 18   EYES: anicteric ENMT: no thrush Cardiovascular: Cor RRR Respiratory: CTA B; normal respiratory effort GI: Bowel sounds are normal, liver is not enlarged, spleen is not enlarged Musculoskeletal: no pedal edema noted Skin: negatives: no rash Hematologic: no cervical lad  Lab Results  Component Value Date   WBC 6.0 01/11/2016   HGB 8.4* 01/11/2016   HCT 24.7* 01/11/2016   MCV 90.0 01/11/2016   PLT 67* 01/11/2016    Lab Results  Component Value Date   CREATININE 1.39* 01/11/2016   BUN 25* 01/11/2016   NA 139 01/11/2016   K 3.9 01/11/2016   CL 113* 01/11/2016   CO2 20* 01/11/2016    Lab Results  Component Value Date   ALT 27 01/11/2016  AST 37 01/11/2016   ALKPHOS 25* 01/11/2016     Microbiology: Recent Results (from the past 240 hour(s))  Urine culture     Status: None (Preliminary result)   Collection Time: 01/09/16  7:57 PM  Result Value Ref Range Status   Specimen Description URINE, RANDOM  Final   Special Requests NONE  Final   Culture   Final    CULTURE REINCUBATED FOR BETTER GROWTH Performed at [facility]    Report Status PENDING  Incomplete  Culture, blood (Routine X 2) w Reflex to ID Panel     Status: Abnormal (Preliminary result)   Collection Time: 01/09/16  8:40 PM  Result Value Ref Range Status   Specimen Description BLOOD RIGHT ANTECUBITAL  Final   Special Requests  BOTTLES DRAWN AEROBIC AND ANAEROBIC 6CC  Final   Culture  Setup Time   Final    GRAM POSITIVE COCCI IN PAIRS IN CHAINS AEROBIC BOTTLE ONLY CRITICAL RESULT CALLED TO, READ BACK BY AND VERIFIED WITH: E HABIB,RN AT 1508 01/10/16 BY L BENFIELD    Culture (A)  Final    ENTEROCOCCUS SPECIES SUSCEPTIBILITIES TO FOLLOW Performed at [facility]    Report Status PENDING  Incomplete  Culture, blood (Routine X 2) w Reflex to ID Panel     Status: Abnormal (Preliminary result)   Collection Time: 01/09/16  9:00 PM  Result Value Ref Range Status   Specimen Description BLOOD BLOOD RIGHT FOREARM  Final   Special Requests BOTTLES DRAWN AEROBIC AND ANAEROBIC 6CC  Final   Culture  Setup Time   Final    GRAM POSITIVE COCCI IN PAIRS IN CHAINS IN BOTH AEROBIC AND ANAEROBIC BOTTLES CRITICAL RESULT CALLED TO, READ BACK BY AND VERIFIED WITH: E HABIB,RN AT 1508 01/10/16 BY L BENFIELD    Culture (A)  Final    ENTEROCOCCUS SPECIES SUSCEPTIBILITIES TO FOLLOW Performed at [facility]    Report Status PENDING  Incomplete    Scharlene Gloss, [facility]ious [facility] [email or web address] [phone] pager  [phone] cell 01/11/2016, 4:35 PM

## 2016-01-12 ENCOUNTER — Inpatient Hospital Stay (HOSPITAL_COMMUNITY): Payer: Medicare Other

## 2016-01-12 DIAGNOSIS — A419 Sepsis, unspecified organism: Secondary | ICD-10-CM

## 2016-01-12 DIAGNOSIS — N390 Urinary tract infection, site not specified: Secondary | ICD-10-CM

## 2016-01-12 DIAGNOSIS — R7881 Bacteremia: Secondary | ICD-10-CM

## 2016-01-12 DIAGNOSIS — N179 Acute kidney failure, unspecified: Secondary | ICD-10-CM

## 2016-01-12 DIAGNOSIS — B952 Enterococcus as the cause of diseases classified elsewhere: Secondary | ICD-10-CM

## 2016-01-12 LAB — URINE CULTURE: Culture: >=100000 — AB

## 2016-01-12 LAB — CULTURE, BLOOD (ROUTINE X 2)

## 2016-01-12 LAB — GLUCOSE, CAPILLARY
GLUCOSE-CAPILLARY: 155 mg/dL — AB (ref 65–99)
GLUCOSE-CAPILLARY: 138 mg/dL — AB (ref 65–99)
Glucose-Capillary: 128 mg/dL — ABNORMAL HIGH (ref 65–99)
Glucose-Capillary: 128 mg/dL — ABNORMAL HIGH (ref 65–99)

## 2016-01-12 LAB — HEMOGLOBIN AND HEMATOCRIT, BLOOD
HCT: 23.2 % — ABNORMAL LOW (ref 39.0–52.0)
HCT: 25.7 % — ABNORMAL LOW (ref 39.0–52.0)
HEMOGLOBIN: 8.8 g/dL — AB (ref 13.0–17.0)
Hemoglobin: 8 g/dL — ABNORMAL LOW (ref 13.0–17.0)

## 2016-01-12 LAB — ECHOCARDIOGRAM COMPLETE
HEIGHTINCHES: 73 in
Weight: 3368 oz

## 2016-01-12 LAB — VANCOMYCIN, TROUGH: Vancomycin Tr: 7 ug/mL — ABNORMAL LOW (ref 10.0–20.0)

## 2016-01-12 MED ORDER — SODIUM CHLORIDE 0.9 % IV SOLN
2.0000 g | Freq: Four times a day (QID) | INTRAVENOUS | Status: DC
  Administered 2016-01-12 – 2016-01-13 (×4): 2 g via INTRAVENOUS
  Filled 2016-01-12 (×5): qty 2000

## 2016-01-12 MED ORDER — VANCOMYCIN HCL 10 G IV SOLR: 1250.0000 mg | Freq: Two times a day (BID) | INTRAVENOUS | Status: DC

## 2016-01-12 MED ORDER — VANCOMYCIN HCL IN DEXTROSE 750-5 MG/150ML-% IV SOLN
750.0000 mg | Freq: Two times a day (BID) | INTRAVENOUS | Status: DC
  Filled 2016-01-12: qty 150

## 2016-01-12 NOTE — Progress Notes (Signed)
*  PRELIMINARY RESULTS* Echocardiogram 2D Echocardiogram has been performed.  Kevin Clements 01/12/2016, 3:09 PM

## 2016-01-12 NOTE — Progress Notes (Signed)
PT Cancellation Note  Patient Details Name: Kevin Clements MRN: [id number] DOB: October 10, 1940   Cancelled Treatment:    Reason Eval/Treat Not Completed: PT screened, no needs identified, will sign off-up ad lib, states no pain in hips and back.    Claretha Cooper 01/12/2016, 4:00 PM Tresa Endo PT [phone]

## 2016-01-12 NOTE — Care Management Important Message (Signed)
Important Message  Patient Details  Name: Zahmari Milkey MRN: [id number] Date of Birth: [birth year]   Medicare Important Message Given:  Yes    Camillo Flaming 01/12/2016, 9:58 AMImportant Message  Patient Details  Name: Oatis Kinkade MRN: [id number] Date of Birth: 08-Aug-1941   Medicare Important Message Given:  Yes    Camillo Flaming 01/12/2016, 9:58 AM

## 2016-01-12 NOTE — Progress Notes (Signed)
Pharmacy Antibiotic Note  Kevin Clements is a 75 y.o. male admitted on 01/09/2016 s/p cystoscopy/TURP (4/6) who presented to on 4/9 with complaint of hematuria and urinary retention.  2/2 blood cultures with GPC in pairs and chains. Patient's currently on day #2 of abx with  zosyn and vancomycin.   - Tmax 102.8 wbc wnl, scr trending down 1.39 (crcl~54)   Plan: -VT=7 (not at [facility]), Scr improved yesterday, no Scr today -Will increase Vanc to 750mg  IV q12h  - continue Zosyn  3.375g q8h (4 hour infusion) - F/u cultures and renal function __________________________  Height: 6\' 1"  (185.4 cm) Weight: 210 lb 8 oz (95.482 kg) IBW/kg (Calculated) : 79.9  Temp (24hrs), Avg:98.9 F (37.2 C), Min:98.4 F (36.9 C), Max:100 F (37.8 C)   Recent Labs Lab 01/05/16 0920 01/06/16 1735 01/07/16 0505 01/09/16 2044 01/09/16 2051 01/09/16 2057 01/09/16 2302 01/10/16 0246 01/11/16 0503 01/11/16 0827 01/12/16 0229  WBC 3.9*  --   --  2.6*  --   --   --  8.7 6.0  --   --   CREATININE 1.27* 1.50* 1.76*  --  1.90*  --   --  1.76* 1.39*  --   --   LATICACIDVEN  --   --   --   --   --  2.33* 2.58* 3.3*  --  1.1  --   VANCOTROUGH  --   --   --   --   --   --   --   --   --   --  7*    Estimated Creatinine Clearance: 54.3 mL/min (by C-G formula based on Cr of 1.39).     No Known Allergies  Antimicrobials this admission: 4/9 Cefepime >>  4/10 4/10 Vanc >> 4/10 zosyn>>  Dose adjustments this admission: n/a  Microbiology results: 4/9 BCx x2:  2/2 GPC in pairs and chains 4/10 Urine Cx:   Thank you for allowing pharmacy to be a part of this patient's care.  Dia Sitter, PharmD, BCPS 01/12/2016 3:40 AM

## 2016-01-12 NOTE — Progress Notes (Signed)
[facility] PROGRESS NOTE  Kevin Clements [id number] DOB: 1941-08-29 DOA: 01/09/2016 PCP: Jani Gravel, MD  Assessment/Plan: 1 Sepsis:  -Present on admission, evidenced by hypotension with blood pressure 78/46, temperature 103.8 heart rate of 118, having positive blood cultures. -Likely secondary to urinary source -Blood cultures obtained on 01/19/2016 growing enterococcus species, organism resistant to gentamicin, sensitive to vancomycin and ampicillin -Repeat blood cultures obtained on 01/11/2016 show no growth 2 sets -Continue vancomycin IV, meanwhile transthoracic echocardiogram is pending at the time of this dictation  2 Bladder outlet obstruction -He recently underwent transurethral resection of prostate, readmitted for clot retention and sepsis. -Urology consulted -Patient overall showing some clinical improvement as his Foley catheter was discontinued on 01/12/2016 -Continue Flomax 0.4 mg by mouth daily  3. HLD -continue HLD  4 Diabetes type 2: not on insulin at home -will continue SSI while inpatient -holding oral hypoglycemic agents   5 Acute renal failure on CKD stage 3 at baseline -secondary to UTI, dehydration and poor perfusion with anemia -His creatinine has improved trending down from 1.9-1.39 on 01/11/2016 -minimize/avoid use of nephrotoxic agents   6 Acute blood loss anemia -status post 2 units PRBc's during this admission -Hgb stable at 8.0  Code Status: Full Family Communication: wife at bedside  Disposition Plan: Anticipate discharge to his home in the next 24-48 hours   Consultants:  Urology   Procedures: TURP and cystolithalopaxy.  Antibiotics:  Vancomycin and Zosyn 01/10/16  HPI/Subjective: States feeling much better today, fully catheter removed, was able to urinate 200 mL's nonbloody urine, no clots  Objective: Filed Vitals:   01/12/16 0603 01/12/16 1331  BP:  127/55  Pulse:  79  Temp: 99.6 F (37.6 C) 99.3 F (37.4 C)   Resp:  18    Intake/Output Summary (Last 24 hours) at 01/12/16 1456 Last data filed at 01/12/16 1331  Gross per 24 hour  Intake    250 ml  Output   5000 ml  Net  -4750 ml   Filed Weights   01/09/16 1932 01/10/16 0030  Weight: 95.255 kg (210 lb) 95.482 kg (210 lb 8 oz)    Exam:   General:  Afebrile currently, no CP or SOB. Feeling much better. Urine is clear and patient denies flank pain.  Cardiovascular: S1 and S2, no rubs or gallops  Respiratory: good air movement, no wheezing or crackles   Abdomen: soft, NT, no distended, positive BS  Musculoskeletal: no edema, no cyanosis   Data Reviewed: Basic Metabolic Panel:  Recent Labs Lab 01/06/16 1735 01/07/16 0505 01/09/16 2051 01/10/16 0246 01/11/16 0503  NA 138 137 139 140 139  K 5.2* 5.2* 4.7 4.1 3.9  CL 108 109 109 112* 113*  CO2 23 21* 22 19* 20*  GLUCOSE 193* 362* 177* 158* 83  BUN 31* 41* 38* 33* 25*  CREATININE 1.50* 1.76* 1.90* 1.76* 1.39*  CALCIUM 8.4* 8.0* 9.0 7.9* 7.6*   Liver Function Tests:  Recent Labs Lab 01/10/16 0246 01/11/16 0503  AST 27 37  ALT 20 27  ALKPHOS 25* 25*  BILITOT 0.3 0.5  PROT 5.4* 5.0*  ALBUMIN 3.1* 2.6*   CBC:  Recent Labs Lab 01/09/16 2044 01/10/16 0246 01/10/16 1842 01/11/16 0503 01/11/16 0827 01/11/16 1921 01/12/16 0229  WBC 2.6* 8.7  --  6.0  --   --   --   NEUTROABS 2.3 8.1*  --   --   --   --   --   HGB 8.4* 7.4* 7.7* 7.6* 8.4* 8.0*  8.0*  HCT 24.7* 21.5* 22.4* 21.5* 24.7* 23.5* 23.2*  MCV 93.2 93.5  --  90.0  --   --   --   PLT 99* 81*  --  67*  --   --   --     ProBNP (last 3 results) No results for input(s): PROBNP in the last 8760 hours.  CBG:  Recent Labs Lab 01/11/16 1155 01/11/16 1647 01/11/16 2117 01/12/16 0746 01/12/16 1214  GLUCAP 96 147* 163* 128* 128*    Recent Results (from the past 240 hour(s))  Urine culture     Status: Abnormal   Collection Time: 01/09/16  7:57 PM  Result Value Ref Range Status   Specimen Description  URINE, RANDOM  Final   Special Requests NONE  Final   Culture >=100,000 COLONIES/mL ENTEROCOCCUS SPECIES (A)  Final   Report Status 01/12/2016 FINAL  Final   Organism ID, Bacteria ENTEROCOCCUS SPECIES (A)  Final      Susceptibility   Enterococcus species - MIC*    AMPICILLIN <=2 SENSITIVE Sensitive     LEVOFLOXACIN 0.5 SENSITIVE Sensitive     NITROFURANTOIN <=16 SENSITIVE Sensitive     VANCOMYCIN 1 SENSITIVE Sensitive     * >=100,000 COLONIES/mL ENTEROCOCCUS SPECIES  Culture, blood (Routine X 2) w Reflex to ID Panel     Status: Abnormal   Collection Time: 01/09/16  8:40 PM  Result Value Ref Range Status   Specimen Description BLOOD RIGHT ANTECUBITAL  Final   Special Requests BOTTLES DRAWN AEROBIC AND ANAEROBIC 6CC  Final   Culture  Setup Time   Final    GRAM POSITIVE COCCI IN PAIRS IN CHAINS AEROBIC BOTTLE ONLY CRITICAL RESULT CALLED TO, READ BACK BY AND VERIFIED WITH: E HABIB,RN AT 1508 01/10/16 BY L BENFIELD Performed at [place] (A)  Final   Report Status 01/12/2016 FINAL  Final   Organism ID, Bacteria ENTEROCOCCUS SPECIES  Final      Susceptibility   Enterococcus species - MIC*    AMPICILLIN <=2 SENSITIVE Sensitive     VANCOMYCIN 1 SENSITIVE Sensitive     GENTAMICIN SYNERGY RESISTANT Resistant     * ENTEROCOCCUS SPECIES  Culture, blood (Routine X 2) w Reflex to ID Panel     Status: Abnormal   Collection Time: 01/09/16  9:00 PM  Result Value Ref Range Status   Specimen Description BLOOD BLOOD RIGHT FOREARM  Final   Special Requests BOTTLES DRAWN AEROBIC AND ANAEROBIC 6CC  Final   Culture  Setup Time   Final    GRAM POSITIVE COCCI IN PAIRS IN CHAINS IN BOTH AEROBIC AND ANAEROBIC BOTTLES CRITICAL RESULT CALLED TO, READ BACK BY AND VERIFIED WITH: E HABIB,RN AT 1508 01/10/16 BY L BENFIELD    Culture (A)  Final    ENTEROCOCCUS SPECIES SUSCEPTIBILITIES PERFORMED ON PREVIOUS CULTURE WITHIN THE LAST 5 DAYS. Performed at [facility]    Report Status 01/12/2016 FINAL  Final  Culture, blood (routine x 2)     Status: None (Preliminary result)   Collection Time: 01/11/16  5:43 PM  Result Value Ref Range Status   Specimen Description BLOOD LEFT ANTECUBITAL  Final   Special Requests BOTTLES DRAWN AEROBIC AND ANAEROBIC 5 CC Ech  Final   Culture   Final    NO GROWTH < 24 HOURS Performed at [facility]    Report Status PENDING  Incomplete  Culture, blood (routine x 2)     Status:  None (Preliminary result)   Collection Time: 01/11/16  5:49 PM  Result Value Ref Range Status   Specimen Description BLOOD LEFT ARM  Final   Special Requests BOTTLES DRAWN AEROBIC AND ANAEROBIC 10 CC EACH  Final   Culture   Final    NO GROWTH < 24 HOURS Performed at [facility]    Report Status PENDING  Incomplete     Studies: No results found.  Scheduled Meds: . diphenhydrAMINE  25 mg Oral Once  . insulin aspart  0-9 Units Subcutaneous TID WC  . simvastatin  20 mg Oral QHS  . sodium chloride flush  3 mL Intravenous Q12H  . tamsulosin  0.4 mg Oral Daily  . vancomycin  750 mg Intravenous Q12H   Continuous Infusions:   Principal Problem:   Sepsis ([place]) Active Problems:   Postoperative anemia due to acute blood loss   UTI (lower urinary tract infection)   Hematuria   AKI (acute kidney injury) ([place])    Time spent: 35 minutes    Kelvin Cellar  [facility] Pager [phone]. If 7PM-7AM, please contact night-coverage at [email or web address], password [facility] 01/12/2016, 2:56 PM  LOS: 3 days

## 2016-01-12 NOTE — Progress Notes (Signed)
Patient ID: Kevin Clements, male   DOB: 05/17/1941, 75 y.o.   MRN: [id number]    Subjective: Kevin Clements is feeling better but he continues to have some pain in the anterior hip area bilaterally that was related to his rigors. He gets relief with tylenol.  This may also have been aggravated by the lithotomy position he was in for the TURP.   He has continued to have a low grade fever and the blood cultures are growing enterococcus with sensitivities pending.  He is to have an echocardiogram today.   His urine remains clear but he passed a couple of tiny clots in the catheter tube while up walking.    ROS:  Review of Systems  All other systems reviewed and are negative.   Anti-infectives: Anti-infectives    Start     Dose/Rate Route Frequency Ordered Stop   01/12/16 1800  vancomycin (VANCOCIN) 1,250 mg in sodium chloride 0.9 % 250 mL IVPB  Status:  Discontinued     1,250 mg 166.7 mL/hr over 90 Minutes Intravenous Every 12 hours 01/12/16 0435 01/12/16 0436   01/12/16 1800  vancomycin (VANCOCIN) IVPB 750 mg/150 ml premix     750 mg 150 mL/hr over 60 Minutes Intravenous Every 12 hours 01/12/16 0436     01/10/16 2200  ceFEPIme (MAXIPIME) 1 g in dextrose 5 % 50 mL IVPB  Status:  Discontinued     1 g 100 mL/hr over 30 Minutes Intravenous Every 24 hours 01/09/16 2128 01/10/16 0821   01/10/16 1130  piperacillin-tazobactam (ZOSYN) IVPB 3.375 g  Status:  Discontinued     3.375 g 12.5 mL/hr over 240 Minutes Intravenous Every 8 hours 01/10/16 1119 01/11/16 1734   01/10/16 0930  piperacillin-tazobactam (ZOSYN) IVPB 3.375 g  Status:  Discontinued     3.375 g 12.5 mL/hr over 240 Minutes Intravenous Every 8 hours 01/10/16 0902 01/10/16 1119   01/10/16 0300  vancomycin (VANCOCIN) 1,250 mg in sodium chloride 0.9 % 250 mL IVPB  Status:  Discontinued     1,250 mg 166.7 mL/hr over 90 Minutes Intravenous Every 24 hours 01/10/16 0235 01/12/16 0435   01/09/16 2115  ceFEPIme (MAXIPIME) 2 g in dextrose 5 % 50 mL  IVPB     2 g 100 mL/hr over 30 Minutes Intravenous  Once 01/09/16 2112 01/09/16 2219      Current Facility-Administered Medications  Medication Dose Route Frequency Provider Last Rate Last Dose  . acetaminophen (TYLENOL) tablet 650 mg  650 mg Oral Q6H PRN Franchot Gallo, MD   650 mg at 01/12/16 0340   Or  . acetaminophen (TYLENOL) suppository 650 mg  650 mg Rectal Q6H PRN Franchot Gallo, MD      . diphenhydrAMINE (BENADRYL) capsule 25 mg  25 mg Oral Once Lily Kocher, MD   25 mg at 01/10/16 0215  . HYDROcodone-acetaminophen (NORCO/VICODIN) 5-325 MG per tablet 1-2 tablet  1-2 tablet Oral Q6H PRN Jeryl Columbia, NP   1 tablet at 01/10/16 2235  . insulin aspart (novoLOG) injection 0-9 Units  0-9 Units Subcutaneous TID WC Lily Kocher, MD   1 Units at 01/11/16 1723  . ondansetron (ZOFRAN) tablet 4 mg  4 mg Oral Q6H PRN Lily Kocher, MD       Or  . ondansetron [facility]) injection 4 mg  4 mg Intravenous Q6H PRN Lily Kocher, MD      . opium-belladonna (B&O SUPPRETTES) 16.2-60 MG suppository 1 suppository  1 suppository Rectal Q8H PRN Lily Kocher, MD      .  simvastatin (ZOCOR) tablet 20 mg  20 mg Oral QHS Lily Kocher, MD   20 mg at 01/11/16 2224  . sodium chloride flush (NS) 0.9 % injection 3 mL  3 mL Intravenous Q12H Lily Kocher, MD   3 mL at 01/11/16 2200  . tamsulosin (FLOMAX) capsule 0.4 mg  0.4 mg Oral Daily Franchot Gallo, MD   0.4 mg at 01/11/16 0956  . vancomycin (VANCOCIN) IVPB 750 mg/150 ml premix  750 mg Intravenous Q12H Dorrene German, RPH         Objective: Vital signs in last 24 hours: Temp:  [98.4 F (36.9 C)-100.2 F (37.9 C)] 99.6 F (37.6 C) (04/12 0603) Pulse Rate:  [75-86] 75 (04/12 0451) Resp:  [18] 18 (04/12 0451) BP: (122-127)/(57-58) 127/57 mmHg (04/12 0451) SpO2:  [97 %-98 %] 97 % (04/12 0451)  Intake/Output from previous day: 04/11 0701 - 04/12 0700 In: 62 [P.O.:240; IV Piggyback:250] Out: 4000 [Urine:4000] Intake/Output this shift:      Physical Exam  Constitutional: He is well-developed, well-nourished, and in no distress.  Vitals reviewed.   Lab Results:   Recent Labs  01/10/16 0246  01/11/16 0503  01/11/16 1921 01/12/16 0229  WBC 8.7  --  6.0  --   --   --   HGB 7.4*  < > 7.6*  < > 8.0* 8.0*  HCT 21.5*  < > 21.5*  < > 23.5* 23.2*  PLT 81*  --  67*  --   --   --   < > = values in this interval not displayed. BMET  Recent Labs  01/10/16 0246 01/11/16 0503  NA 140 139  K 4.1 3.9  CL 112* 113*  CO2 19* 20*  GLUCOSE 158* 83  BUN 33* 25*  CREATININE 1.76* 1.39*  CALCIUM 7.9* 7.6*   PT/INR  Recent Labs  01/09/16 2351  LABPROT 15.5*  INR 1.26   ABG No results for input(s): PHART, HCO3 in the last 72 hours.  Invalid input(s): PCO2, PO2  Studies/Results: No results found.   Assessment and Plan: Kevin Clements urine is clear and his Hgb is stable at 8.  The fever curve is declining and he has enterococcus on culture.  He continues to have hip pain that improves with tylenol.     I will order the foley out in the morning.    I put in a PT consult for the hip pain since he may need some stretching to help relieve it.       LOS: 3 days    Kevin Clements 01/12/2016 [id number]

## 2016-01-12 NOTE — Progress Notes (Signed)
    [facility]   Reason for visit: Follow up on Enterococcal infection  Interval History: culture is pansensitive, afebrile. Echo pending. Repeat cultures sent.   Physical Exam: Constitutional:  Filed Vitals:   01/12/16 0603 01/12/16 1331  BP:  127/55  Pulse:  79  Temp: 99.6 F (37.6 C) 99.3 F (37.4 C)  Resp:  18   patient appears in NAD  Impression: Stable Enterococcal urinary infection.   Plan: 1.  I have changed to ampicillin.

## 2016-01-13 ENCOUNTER — Encounter (HOSPITAL_COMMUNITY): Payer: Self-pay | Admitting: *Deleted

## 2016-01-13 ENCOUNTER — Encounter (HOSPITAL_COMMUNITY)
Admission: EM | Disposition: A | Discharge status: Home / Self Care | Payer: Self-pay | Source: Home / Self Care | Attending: Internal Medicine

## 2016-01-13 ENCOUNTER — Inpatient Hospital Stay (HOSPITAL_COMMUNITY): Payer: Medicare Other

## 2016-01-13 DIAGNOSIS — R7881 Bacteremia: Secondary | ICD-10-CM | POA: Insufficient documentation

## 2016-01-13 DIAGNOSIS — D62 Acute posthemorrhagic anemia: Secondary | ICD-10-CM

## 2016-01-13 HISTORY — PX: TEE WITHOUT CARDIOVERSION: SHX5443

## 2016-01-13 LAB — BASIC METABOLIC PANEL
Anion gap: 9 (ref 5–15)
BUN: 21 mg/dL — AB (ref 6–20)
CALCIUM: 8.5 mg/dL — AB (ref 8.9–10.3)
CO2: 24 mmol/L (ref 22–32)
CREATININE: 1.2 mg/dL (ref 0.61–1.24)
Chloride: 109 mmol/L (ref 101–111)
GFR calc non Af Amer: 59 mL/min — ABNORMAL LOW (ref >60)
Glucose, Bld: 140 mg/dL — ABNORMAL HIGH (ref 65–99)
Potassium: 4 mmol/L (ref 3.5–5.1)
SODIUM: 142 mmol/L (ref 135–145)

## 2016-01-13 LAB — CBC
HCT: 25.3 % — ABNORMAL LOW (ref 39.0–52.0)
Hemoglobin: 8.6 g/dL — ABNORMAL LOW (ref 13.0–17.0)
MCH: 31.4 pg (ref 26.0–34.0)
MCHC: 34 g/dL (ref 30.0–36.0)
MCV: 92.3 fL (ref 78.0–100.0)
PLATELETS: 89 10*3/uL — AB (ref 150–400)
RBC: 2.74 MIL/uL — AB (ref 4.22–5.81)
RDW: 15.5 % (ref 11.5–15.5)
WBC: 4.7 10*3/uL (ref 4.0–10.5)

## 2016-01-13 LAB — GLUCOSE, CAPILLARY
GLUCOSE-CAPILLARY: 127 mg/dL — AB (ref 65–99)
GLUCOSE-CAPILLARY: 130 mg/dL — AB (ref 65–99)

## 2016-01-13 SURGERY — ECHOCARDIOGRAM, TRANSESOPHAGEAL
Anesthesia: Moderate Sedation

## 2016-01-13 MED ORDER — MIDAZOLAM HCL 10 MG/2ML IJ SOLN
INTRAMUSCULAR | Status: DC | PRN
  Administered 2016-01-13: 1 mg via INTRAVENOUS
  Administered 2016-01-13: 2 mg via INTRAVENOUS
  Administered 2016-01-13: 1 mg via INTRAVENOUS
  Administered 2016-01-13: 2 mg via INTRAVENOUS

## 2016-01-13 MED ORDER — FENTANYL CITRATE (PF) 100 MCG/2ML IJ SOLN
INTRAMUSCULAR | Status: DC | PRN
  Administered 2016-01-13: 50 ug via INTRAVENOUS
  Administered 2016-01-13 (×2): 25 ug via INTRAVENOUS

## 2016-01-13 MED ORDER — BUTAMBEN-TETRACAINE-BENZOCAINE 2-2-14 % EX AERO
INHALATION_SPRAY | CUTANEOUS | Status: DC | PRN
  Administered 2016-01-13: 2 via TOPICAL

## 2016-01-13 MED ORDER — FENTANYL CITRATE (PF) 100 MCG/2ML IJ SOLN
INTRAMUSCULAR | Status: AC
Start: 2016-01-13 — End: 2016-01-13
  Filled 2016-01-13: qty 2

## 2016-01-13 MED ORDER — MIDAZOLAM HCL 5 MG/ML IJ SOLN
INTRAMUSCULAR | Status: AC
Start: 2016-01-13 — End: 2016-01-13
  Filled 2016-01-13: qty 2

## 2016-01-13 MED ORDER — SODIUM CHLORIDE 0.9 % IV SOLN
INTRAVENOUS | Status: DC
  Administered 2016-01-13: 12:00:00 via INTRAVENOUS

## 2016-01-13 MED ORDER — AMOXICILLIN 500 MG PO CAPS: 500.0000 mg | ORAL_CAPSULE | Freq: Three times a day (TID) | ORAL | Status: DC

## 2016-01-13 NOTE — Progress Notes (Signed)
Patient ID: Kevin Clements, male   DOB: Jan 01, 1941, 75 y.o.   MRN: [id number]    Subjective: Kevin Clements is voiding well with clear urine since the foley was removed yesterday.   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  The hip pain has improved with mobilization.  ROS:  ROS  Anti-infectives: Anti-infectives    Start     Dose/Rate Route Frequency Ordered Stop   01/12/16 1800  vancomycin (VANCOCIN) 1,250 mg in sodium chloride 0.9 % 250 mL IVPB  Status:  Discontinued     1,250 mg 166.7 mL/hr over 90 Minutes Intravenous Every 12 hours 01/12/16 0435 01/12/16 0436   01/12/16 1800  vancomycin (VANCOCIN) IVPB 750 mg/150 ml premix  Status:  Discontinued     750 mg 150 mL/hr over 60 Minutes Intravenous Every 12 hours 01/12/16 0436 01/12/16 1505   01/12/16 1800  ampicillin (OMNIPEN) 2 g in sodium chloride 0.9 % 50 mL IVPB    Comments:  Pharmacy to adjust as needed.   2 g 150 mL/hr over 20 Minutes Intravenous 4 times per day 01/12/16 1505     01/10/16 2200  ceFEPIme (MAXIPIME) 1 g in dextrose 5 % 50 mL IVPB  Status:  Discontinued     1 g 100 mL/hr over 30 Minutes Intravenous Every 24 hours 01/09/16 2128 01/10/16 0821   01/10/16 1130  piperacillin-tazobactam (ZOSYN) IVPB 3.375 g  Status:  Discontinued     3.375 g 12.5 mL/hr over 240 Minutes Intravenous Every 8 hours 01/10/16 1119 01/11/16 1734   01/10/16 0930  piperacillin-tazobactam (ZOSYN) IVPB 3.375 g  Status:  Discontinued     3.375 g 12.5 mL/hr over 240 Minutes Intravenous Every 8 hours 01/10/16 0902 01/10/16 1119   01/10/16 0300  vancomycin (VANCOCIN) 1,250 mg in sodium chloride 0.9 % 250 mL IVPB  Status:  Discontinued     1,250 mg 166.7 mL/hr over 90 Minutes Intravenous Every 24 hours 01/10/16 0235 01/12/16 0435   01/09/16 2115  ceFEPIme (MAXIPIME) 2 g in dextrose 5 % 50 mL IVPB     2 g 100 mL/hr over 30 Minutes Intravenous  Once 01/09/16 2112 01/09/16 2219       Current Facility-Administered Medications  Medication Dose Route Frequency Provider Last Rate Last Dose  . acetaminophen (TYLENOL) tablet 650 mg  650 mg Oral Q6H PRN Franchot Gallo, MD   650 mg at 01/13/16 0009   Or  . acetaminophen (TYLENOL) suppository 650 mg  650 mg Rectal Q6H PRN Franchot Gallo, MD      . ampicillin (OMNIPEN) 2 g in sodium chloride 0.9 % 50 mL IVPB  2 g Intravenous 4 times per day Thayer Headings, MD   2 g at 01/13/16 0505  . diphenhydrAMINE (BENADRYL) capsule 25 mg  25 mg Oral Once Lily Kocher, MD   25 mg at 01/10/16 0215  . HYDROcodone-acetaminophen (NORCO/VICODIN) 5-325 MG per tablet 1-2 tablet  1-2 tablet Oral Q6H PRN Jeryl Columbia, NP   1 tablet at 01/10/16 2235  . insulin aspart (novoLOG) injection 0-9 Units  0-9 Units Subcutaneous TID WC Lily Kocher, MD   2 Units at 01/12/16 1806  . ondansetron (ZOFRAN) tablet 4 mg  4 mg Oral Q6H PRN Lily Kocher, MD       Or  . ondansetron [facility]) injection 4 mg  4 mg Intravenous Q6H PRN Lily Kocher, MD      . opium-belladonna (B&O SUPPRETTES) 16.2-60  MG suppository 1 suppository  1 suppository Rectal Q8H PRN Lily Kocher, MD      . simvastatin (ZOCOR) tablet 20 mg  20 mg Oral QHS Lily Kocher, MD   20 mg at 01/12/16 2104  . sodium chloride flush (NS) 0.9 % injection 3 mL  3 mL Intravenous Q12H Lily Kocher, MD   3 mL at 01/12/16 2105  . tamsulosin (FLOMAX) capsule 0.4 mg  0.4 mg Oral Daily Franchot Gallo, MD   0.4 mg at 01/12/16 0859     Objective: Vital signs in last 24 hours: Temp:  [98.2 F (36.8 C)-99.3 F (37.4 C)] 98.2 F (36.8 C) (04/13 0508) Pulse Rate:  [67-79] 69 (04/13 0508) Resp:  [16-18] 16 (04/13 0508) BP: (119-144)/(55-68) 144/68 mmHg (04/13 0508) SpO2:  [99 %-100 %] 99 % (04/13 0508)  Intake/Output from previous day: 04/12 0701 - 04/13 0700 In: 750 [P.O.:600; IV Piggyback:150] Out: 3900 [Urine:3900] Intake/Output this shift:     Physical Exam  Lab Results:   Recent Labs   01/11/16 0503  01/12/16 2008 01/13/16 0513  WBC 6.0  --   --  4.7  HGB 7.6*  < > 8.8* 8.6*  HCT 21.5*  < > 25.7* 25.3*  PLT 67*  --   --  89*  < > = values in this interval not displayed. BMET  Recent Labs  01/11/16 0503 01/13/16 0513  NA 139 142  K 3.9 4.0  CL 113* 109  CO2 20* 24  GLUCOSE 83 140*  BUN 25* 21*  CREATININE 1.39* 1.20  CALCIUM 7.6* 8.5*   PT/INR No results for input(s): LABPROT, INR in the last 72 hours. ABG No results for input(s): PHART, HCO3 in the last 72 hours.  Invalid input(s): PCO2, PO2  Studies/Results: No results found.   Assessment and Plan: He is voiding well with clear urine.  Sepsis is resolving with a T max of 100.2.    Dr. Coralyn Pear is consulting cardiology regarding the TEE.  He is ok for discharge from a GU standpoint and has a f/u with Dr. Diona Fanti scheduled.       LOS: 4 days    [name] 01/13/2016 [id number]

## 2016-01-13 NOTE — Interval H&P Note (Signed)
History and Physical Interval Note: No interval changes. 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 Will proceed with TEE for clarification.  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 After consideration of risks, benefits and other options for treatment, the patient has consented to  Procedure(s): TRANSESOPHAGEAL ECHOCARDIOGRAM (TEE) (N/A) as a surgical intervention .  The patient's history has been reviewed, patient examined, no change in status, stable for surgery.  I have reviewed the patient's chart and labs.  Questions were answered to the patient's satisfaction.     Kate Sable A

## 2016-01-13 NOTE — Procedures (Signed)
Preliminary TEE report: Normal LV systolic function. No significant valvular pathology. No vegetations.

## 2016-01-13 NOTE — Progress Notes (Signed)
Completed D/C teaching with patient. Answered questions. Gave prescriptions. Patient will be discharged home with family in stable condition.

## 2016-01-13 NOTE — Progress Notes (Signed)
  Echocardiogram Echocardiogram Transesophageal has been performed.  Donata Clay 01/13/2016, 3:12 PM

## 2016-01-13 NOTE — Progress Notes (Signed)
    [facility] has been requested to perform a transesophageal echocardiogram on 01/13/16 for bacteremia.  After careful review of history and examination, the risks and benefits of transesophageal echocardiogram have been explained including risks of esophageal damage, perforation (1:10,000 risk), bleeding, pharyngeal hematoma as well as other potential complications associated with conscious sedation including aspiration, arrhythmia, respiratory failure and death. Alternatives to treatment were discussed, questions were answered. Patient is willing to proceed.   Eileen Stanford,  01/13/2016 8:34 AM

## 2016-01-13 NOTE — Discharge Summary (Signed)
Physician Discharge Summary  Kevin Clements [id number] DOB: Jan 25, 1941 DOA: 01/09/2016  PCP: Jani Gravel, MD  Admit date: 01/09/2016 Discharge date: 01/13/2016  Time spent: 35 minutes  Recommendations for Outpatient Follow-up:  1. Kevin Clements was discharged on a two-week course of amoxicillin for enterococcus bacteremia as recommended by Dr Linus Salmons of ID. Transesophageal echocardiogram negative for vegetation. 2. Recommend following up on a BMP and CBC on hospital follow-up visit   Discharge Diagnoses:  Principal Problem:   Sepsis ([place]) Active Problems:   Postoperative anemia due to acute blood loss   UTI (lower urinary tract infection)   Hematuria   AKI (acute kidney injury) ([place])   Bacteremia   Discharge Condition: Stable  Diet recommendation: Heart healthy  Filed Weights   01/09/16 1932 01/10/16 0030  Weight: 95.255 kg (210 lb) 95.482 kg (210 lb 8 oz)    History of present illness:  Kevin Clements is a 75 y.o. 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 He is S/P TURP. His postoperative course was complicated by ABLA; he required 2 units of PRBCs. He was discharged home with a hemoglobin of 8.5 (it was normal on previous admission). Foley catheter was removed prior to discharge. In the past 24 hours, the patient has noted continued hematuria, decreased urine output, and bladder distention. He has had persistent weakness and DOE, which he attributes to his anemia. He has had nausea but no significant vomiting. He is appetite has been normal. No chest pain, light-headedness, headache, or abdominal pain. He presented to the ED because he though he would have a foley catheter placed for the retention and expected to follow-up with his urologist in clinic this week. However, he spiked a fever to 103.8 with associated tachycardia (heart  rate 120's). Sepsis protocol was activated. The patient has received empiric Cefepime and aggressive IV fluid resuscitation has been initiated. Blood and urine cultures are pending. Hospitalist asked to admit for further evaluation and treatment.  Hospital Course:  Kevin Clements is a pleasant 75 year old gentleman with a past medical history of bilateral outlet obstruction who recently underwent transurethral resection of prostate, was readmitted to the medicine service for clot retention and sepsis. He presented with hypotension have a blood pressure of 70/46, temperature 103.8 with blood cultures turning positive for enterococcus suspected to be of urinary source. Blood culture obtained on 01/19/2016 growing enterococcus species sensitive to vancomycin and ampicillin. Initially he was treated with IV vancomycin. Repeat blood cultures drawn on 01/11/2016 showing no growth 2 sets. During this hospitalization he was seen by Dr. Roni Bread of urology. Initially a Foley catheter had been placed however this was continued on 01/12/2016 after which he had good urinary flow without evidence of hematuria. During this hospitalization he was transfused with 2 units of packed red blood cells likely secondary to acute blood loss from history of hematuria. He showed overall clinical improvement. A transesophageal echocardiogram performed on 01/13/2016 did not reveal evidence of vegetation. I discussed echo findings with Dr. Linus Salmons of infectious disease who recommended a 2 week course of oral ampicillin. He was discharged in stable condition to his home on 01/13/2016.  Procedures:  Transthoracic echocardiogram impression: - Left ventricle: The cavity size was normal. There was mild focal  basal hypertrophy of the septum. Systolic function was normal.  The estimated ejection fraction was in the range of 55% to 60%.  Wall motion was normal;  there were no regional wall motion  abnormalities. - Aortic valve: Trileaflet;  mildly thickened, mildly calcified  leaflets. - Mitral valve: There is a thin mobile density off of the MV  apparatus that is worrisome for vegetation but could be Redundant  chordae tendinae. Recommend TEE For further evaluation. Calcified  annulus.  Transesophageal echocardiogram impression: Preliminary TEE report: Normal LV systolic function. No significant valvular pathology. No vegetations.  Consultations:  Cardiology  Urology  Infectious disease  Discharge Exam: Filed Vitals:   01/13/16 1530 01/13/16 1600  BP: 145/74 139/74  Pulse: 67 74  Temp:  97.8 F (36.6 C)  Resp: 13 18    General: No acute distress, looks well, anxious to go home today. Cardiovascular: Regular rate and rhythm normal S1-S2 no murmurs rubs or gallops no extremity edema. Respiratory: Normal respiratory effort, lungs are clear to auscultation bilaterally Abdomen: Soft nontender nondistended  Discharge Instructions   Discharge Instructions    Call MD for:  difficulty breathing, headache or visual disturbances    Complete by:  As directed      Call MD for:  extreme fatigue    Complete by:  As directed      Call MD for:  hives    Complete by:  As directed      Call MD for:  persistant dizziness or light-headedness    Complete by:  As directed      Call MD for:  persistant nausea and vomiting    Complete by:  As directed      Call MD for:  redness, tenderness, or signs of infection (pain, swelling, redness, odor or green/yellow discharge around incision site)    Complete by:  As directed      Call MD for:  severe uncontrolled pain    Complete by:  As directed      Call MD for:  temperature >100.4    Complete by:  As directed      Call MD for:    Complete by:  As directed      Diet - low sodium heart healthy    Complete by:  As directed      Increase activity slowly    Complete by:  As directed           Current Discharge Medication List    START taking these medications    Details  amoxicillin (AMOXIL) 500 MG capsule Take 1 capsule (500 mg total) by mouth 3 (three) times daily. Qty: 42 capsule, Refills: 0      CONTINUE these medications which have NOT CHANGED   Details  amLODipine (NORVASC) 5 MG tablet Take 5 mg by mouth every morning.    carvedilol (COREG) 3.125 MG tablet Take 3.125 mg by mouth 2 (two) times daily.    Cholecalciferol (VITAMIN D) 2000 units CAPS Take 1 capsule by mouth 2 (two) times daily.    CINNAMON PO Take 1,000 mg by mouth 2 (two) times daily.    glimepiride (AMARYL) 4 MG tablet Take 4 mg by mouth 2 (two) times daily.    metFORMIN (GLUCOPHAGE) 500 MG tablet Take 500 mg by mouth at bedtime.    Multiple Vitamin (MULTIVITAMIN WITH MINERALS) TABS tablet Take 1 tablet by mouth every morning.    Omega-3 Fatty Acids (FISH OIL) 1200 MG CAPS Take 1 capsule by mouth every morning.    pioglitazone (ACTOS) 30 MG tablet Take 30 mg by mouth at bedtime.    simvastatin (ZOCOR) 20 MG tablet Take 20  mg by mouth at bedtime.      STOP taking these medications     sulfamethoxazole-trimethoprim (BACTRIM DS,SEPTRA DS) 800-160 MG tablet        No Known Allergies Follow-up Information    Follow up with Jani Gravel, MD In 2 weeks.   Specialty:  Internal Medicine   Contact information:   [place] [address] [phone]       Follow up with Kevin Loa, MD In 1 week.   Specialty:  Urology   Contact information:   [place] [address] [phone]        The results of significant diagnostics from this hospitalization (including imaging, microbiology, ancillary and laboratory) are listed below for reference.    Significant Diagnostic Studies: Dg Chest 2 View  01/09/2016  CLINICAL DATA:  Unable to urinate since 12:30. Vomiting all day. Trans urethral resection of the prostate done 3 days ago. EXAM: CHEST  2 VIEW COMPARISON:  11/10/2010 FINDINGS: The heart size and mediastinal contours  are within normal limits. Both lungs are clear. The visualized skeletal structures are unremarkable. IMPRESSION: No active cardiopulmonary disease. Electronically Signed   By: Andreas Newport [name]   On: 01/09/2016 21:35    Microbiology: Recent Results (from the past 240 hour(s))  Urine culture     Status: Abnormal   Collection Time: 01/09/16  7:57 PM  Result Value Ref Range Status   Specimen Description URINE, RANDOM  Final   Special Requests NONE  Final   Culture >=100,000 COLONIES/mL ENTEROCOCCUS SPECIES (A)  Final   Report Status 01/12/2016 FINAL  Final   Organism ID, Bacteria ENTEROCOCCUS SPECIES (A)  Final      Susceptibility   Enterococcus species - MIC*    AMPICILLIN <=2 SENSITIVE Sensitive     LEVOFLOXACIN 0.5 SENSITIVE Sensitive     NITROFURANTOIN <=16 SENSITIVE Sensitive     VANCOMYCIN 1 SENSITIVE Sensitive     * >=100,000 COLONIES/mL ENTEROCOCCUS SPECIES  Culture, blood (Routine X 2) w Reflex to ID Panel     Status: Abnormal   Collection Time: 01/09/16  8:40 PM  Result Value Ref Range Status   Specimen Description BLOOD RIGHT ANTECUBITAL  Final   Special Requests BOTTLES DRAWN AEROBIC AND ANAEROBIC 6CC  Final   Culture  Setup Time   Final    GRAM POSITIVE COCCI IN PAIRS IN CHAINS AEROBIC BOTTLE ONLY CRITICAL RESULT CALLED TO, READ BACK BY AND VERIFIED WITH: E HABIB,RN AT 1508 01/10/16 BY L BENFIELD Performed at [place] [facility] (A)  Final   Report Status 01/12/2016 FINAL  Final   Organism ID, Bacteria ENTEROCOCCUS SPECIES  Final      Susceptibility   Enterococcus species - MIC*    AMPICILLIN <=2 SENSITIVE Sensitive     VANCOMYCIN 1 SENSITIVE Sensitive     GENTAMICIN SYNERGY RESISTANT Resistant     * ENTEROCOCCUS SPECIES  Culture, blood (Routine X 2) w Reflex to ID Panel     Status: Abnormal   Collection Time: 01/09/16  9:00 PM  Result Value Ref Range Status   Specimen Description BLOOD BLOOD RIGHT FOREARM  Final   Special  Requests BOTTLES DRAWN AEROBIC AND ANAEROBIC 6CC  Final   Culture  Setup Time   Final    GRAM POSITIVE COCCI IN PAIRS IN CHAINS IN BOTH AEROBIC AND ANAEROBIC BOTTLES CRITICAL RESULT CALLED TO, READ BACK BY AND VERIFIED WITH: E HABIB,RN AT  1508 01/10/16 BY L BENFIELD    Culture (A)  Final    ENTEROCOCCUS SPECIES SUSCEPTIBILITIES PERFORMED ON PREVIOUS CULTURE WITHIN THE LAST 5 DAYS. Performed at [facility]    Report Status 01/12/2016 FINAL  Final  Culture, blood (routine x 2)     Status: None (Preliminary result)   Collection Time: 01/11/16  5:43 PM  Result Value Ref Range Status   Specimen Description BLOOD LEFT ANTECUBITAL  Final   Special Requests BOTTLES DRAWN AEROBIC AND ANAEROBIC 5 CC Ech  Final   Culture   Final    NO GROWTH 2 DAYS Performed at [facility]    Report Status PENDING  Incomplete  Culture, blood (routine x 2)     Status: None (Preliminary result)   Collection Time: 01/11/16  5:49 PM  Result Value Ref Range Status   Specimen Description BLOOD LEFT ARM  Final   Special Requests BOTTLES DRAWN AEROBIC AND ANAEROBIC 10 CC EACH  Final   Culture   Final    NO GROWTH 2 DAYS Performed at [facility]    Report Status PENDING  Incomplete     Labs: Basic Metabolic Panel:  Recent Labs Lab 01/07/16 0505 01/09/16 2051 01/10/16 0246 01/11/16 0503 01/13/16 0513  NA 137 139 140 139 142  K 5.2* 4.7 4.1 3.9 4.0  CL 109 109 112* 113* 109  CO2 21* 22 19* 20* 24  GLUCOSE 362* 177* 158* 83 140*  BUN 41* 38* 33* 25* 21*  CREATININE 1.76* 1.90* 1.76* 1.39* 1.20  CALCIUM 8.0* 9.0 7.9* 7.6* 8.5*   Liver Function Tests:  Recent Labs Lab 01/10/16 0246 01/11/16 0503  AST 27 37  ALT 20 27  ALKPHOS 25* 25*  BILITOT 0.3 0.5  PROT 5.4* 5.0*  ALBUMIN 3.1* 2.6*   No results for input(s): LIPASE, AMYLASE in the last 168 hours. No results for input(s): AMMONIA in the last 168 hours. CBC:  Recent Labs Lab 01/09/16 2044 01/10/16 0246   01/11/16 0503 01/11/16 0827 01/11/16 1921 01/12/16 0229 01/12/16 2008 01/13/16 0513  WBC 2.6* 8.7  --  6.0  --   --   --   --  4.7  NEUTROABS 2.3 8.1*  --   --   --   --   --   --   --   HGB 8.4* 7.4*  < > 7.6* 8.4* 8.0* 8.0* 8.8* 8.6*  HCT 24.7* 21.5*  < > 21.5* 24.7* 23.5* 23.2* 25.7* 25.3*  MCV 93.2 93.5  --  90.0  --   --   --   --  92.3  PLT 99* 81*  --  67*  --   --   --   --  89*  < > = values in this interval not displayed. Cardiac Enzymes: No results for input(s): CKTOTAL, CKMB, CKMBINDEX, TROPONINI in the last 168 hours. BNP: BNP (last 3 results) No results for input(s): BNP in the last 8760 hours.  ProBNP (last 3 results) No results for input(s): PROBNP in the last 8760 hours.  CBG:  Recent Labs Lab 01/12/16 1214 01/12/16 1710 01/12/16 2143 01/13/16 0815 01/13/16 1130  GLUCAP 128* 155* 138* 127* 130*       Signed:  Kelvin Cellar MD.  [facility] 01/13/2016, 5:15 PM

## 2016-01-13 NOTE — Consult Note (Signed)
   [facility] CM Inpatient Consult   01/13/2016  Kevin Clements 1941-04-09 [id number]   Patient screened for [place] Management services. Went to bedside. However, he appears to be off the unit for a procedure/test. Will follow up at later time for potential [place] Management services.  Marthenia Rolling, MSN-Ed, RN,BSN [facility] Liaison [phone]

## 2016-01-13 NOTE — H&P (View-Only) (Signed)
[facility] PROGRESS NOTE  Kevin Clements [id number] DOB: [birth year] DOA: 01/09/2016 PCP: Jani Gravel, MD  Assessment/Plan: 1 Sepsis:  -Present on admission, evidenced by hypotension with blood pressure 78/46, temperature 103.8 heart rate of 118, having positive blood cultures. -Likely secondary to urinary source -Blood cultures obtained on 01/19/2016 growing enterococcus species, organism resistant to gentamicin, sensitive to vancomycin and ampicillin -Repeat blood cultures obtained on 01/11/2016 show no growth 2 sets -Continue vancomycin IV, meanwhile transthoracic echocardiogram is pending at the time of this dictation  2 Bladder outlet obstruction -He recently underwent transurethral resection of prostate, readmitted for clot retention and sepsis. -Urology consulted -Patient overall showing some clinical improvement as his Foley catheter was discontinued on 01/12/2016 -Continue Flomax 0.4 mg by mouth daily  3. HLD -continue HLD  4 Diabetes type 2: not on insulin at home -will continue SSI while inpatient -holding oral hypoglycemic agents   5 Acute renal failure on CKD stage 3 at baseline -secondary to UTI, dehydration and poor perfusion with anemia -His creatinine has improved trending down from 1.9-1.39 on 01/11/2016 -minimize/avoid use of nephrotoxic agents   6 Acute blood loss anemia -status post 2 units PRBc's during this admission -Hgb stable at 8.0  Code Status: Full Family Communication: wife at bedside  Disposition Plan: Anticipate discharge to his home in the next 24-48 hours   Consultants:  Urology   Procedures: TURP and cystolithalopaxy.  Antibiotics:  Vancomycin and Zosyn 01/10/16  HPI/Subjective: States feeling much better today, fully catheter removed, was able to urinate 200 mL's nonbloody urine, no clots  Objective: Filed Vitals:   01/12/16 0603 01/12/16 1331  BP:  127/55  Pulse:  79  Temp: 99.6 F (37.6 C) 99.3 F (37.4 C)   Resp:  18    Intake/Output Summary (Last 24 hours) at 01/12/16 1456 Last data filed at 01/12/16 1331  Gross per 24 hour  Intake    250 ml  Output   5000 ml  Net  -4750 ml   Filed Weights   01/09/16 1932 01/10/16 0030  Weight: 95.255 kg (210 lb) 95.482 kg (210 lb 8 oz)    Exam:   General:  Afebrile currently, no CP or SOB. Feeling much better. Urine is clear and patient denies flank pain.  Cardiovascular: S1 and S2, no rubs or gallops  Respiratory: good air movement, no wheezing or crackles   Abdomen: soft, NT, no distended, positive BS  Musculoskeletal: no edema, no cyanosis   Data Reviewed: Basic Metabolic Panel:  Recent Labs Lab 01/06/16 1735 01/07/16 0505 01/09/16 2051 01/10/16 0246 01/11/16 0503  NA 138 137 139 140 139  K 5.2* 5.2* 4.7 4.1 3.9  CL 108 109 109 112* 113*  CO2 23 21* 22 19* 20*  GLUCOSE 193* 362* 177* 158* 83  BUN 31* 41* 38* 33* 25*  CREATININE 1.50* 1.76* 1.90* 1.76* 1.39*  CALCIUM 8.4* 8.0* 9.0 7.9* 7.6*   Liver Function Tests:  Recent Labs Lab 01/10/16 0246 01/11/16 0503  AST 27 37  ALT 20 27  ALKPHOS 25* 25*  BILITOT 0.3 0.5  PROT 5.4* 5.0*  ALBUMIN 3.1* 2.6*   CBC:  Recent Labs Lab 01/09/16 2044 01/10/16 0246 01/10/16 1842 01/11/16 0503 01/11/16 0827 01/11/16 1921 01/12/16 0229  WBC 2.6* 8.7  --  6.0  --   --   --   NEUTROABS 2.3 8.1*  --   --   --   --   --   HGB 8.4* 7.4* 7.7* 7.6* 8.4* 8.0*  8.0*  HCT 24.7* 21.5* 22.4* 21.5* 24.7* 23.5* 23.2*  MCV 93.2 93.5  --  90.0  --   --   --   PLT 99* 81*  --  67*  --   --   --     ProBNP (last 3 results) No results for input(s): PROBNP in the last 8760 hours.  CBG:  Recent Labs Lab 01/11/16 1155 01/11/16 1647 01/11/16 2117 01/12/16 0746 01/12/16 1214  GLUCAP 96 147* 163* 128* 128*    Recent Results (from the past 240 hour(s))  Urine culture     Status: Abnormal   Collection Time: 01/09/16  7:57 PM  Result Value Ref Range Status   Specimen Description  URINE, RANDOM  Final   Special Requests NONE  Final   Culture >=100,000 COLONIES/mL ENTEROCOCCUS SPECIES (A)  Final   Report Status 01/12/2016 FINAL  Final   Organism ID, Bacteria ENTEROCOCCUS SPECIES (A)  Final      Susceptibility   Enterococcus species - MIC*    AMPICILLIN <=2 SENSITIVE Sensitive     LEVOFLOXACIN 0.5 SENSITIVE Sensitive     NITROFURANTOIN <=16 SENSITIVE Sensitive     VANCOMYCIN 1 SENSITIVE Sensitive     * >=100,000 COLONIES/mL ENTEROCOCCUS SPECIES  Culture, blood (Routine X 2) w Reflex to ID Panel     Status: Abnormal   Collection Time: 01/09/16  8:40 PM  Result Value Ref Range Status   Specimen Description BLOOD RIGHT ANTECUBITAL  Final   Special Requests BOTTLES DRAWN AEROBIC AND ANAEROBIC 6CC  Final   Culture  Setup Time   Final    GRAM POSITIVE COCCI IN PAIRS IN CHAINS AEROBIC BOTTLE ONLY CRITICAL RESULT CALLED TO, READ BACK BY AND VERIFIED WITH: E HABIB,RN AT 1508 01/10/16 BY L BENFIELD Performed at [place] (A)  Final   Report Status 01/12/2016 FINAL  Final   Organism ID, Bacteria ENTEROCOCCUS SPECIES  Final      Susceptibility   Enterococcus species - MIC*    AMPICILLIN <=2 SENSITIVE Sensitive     VANCOMYCIN 1 SENSITIVE Sensitive     GENTAMICIN SYNERGY RESISTANT Resistant     * ENTEROCOCCUS SPECIES  Culture, blood (Routine X 2) w Reflex to ID Panel     Status: Abnormal   Collection Time: 01/09/16  9:00 PM  Result Value Ref Range Status   Specimen Description BLOOD BLOOD RIGHT FOREARM  Final   Special Requests BOTTLES DRAWN AEROBIC AND ANAEROBIC 6CC  Final   Culture  Setup Time   Final    GRAM POSITIVE COCCI IN PAIRS IN CHAINS IN BOTH AEROBIC AND ANAEROBIC BOTTLES CRITICAL RESULT CALLED TO, READ BACK BY AND VERIFIED WITH: E HABIB,RN AT 1508 01/10/16 BY L BENFIELD    Culture (A)  Final    ENTEROCOCCUS SPECIES SUSCEPTIBILITIES PERFORMED ON PREVIOUS CULTURE WITHIN THE LAST 5 DAYS. Performed at [facility]    Report Status 01/12/2016 FINAL  Final  Culture, blood (routine x 2)     Status: None (Preliminary result)   Collection Time: 01/11/16  5:43 PM  Result Value Ref Range Status   Specimen Description BLOOD LEFT ANTECUBITAL  Final   Special Requests BOTTLES DRAWN AEROBIC AND ANAEROBIC 5 CC Ech  Final   Culture   Final    NO GROWTH < 24 HOURS Performed at [facility]    Report Status PENDING  Incomplete  Culture, blood (routine x 2)     Status:  None (Preliminary result)   Collection Time: 01/11/16  5:49 PM  Result Value Ref Range Status   Specimen Description BLOOD LEFT ARM  Final   Special Requests BOTTLES DRAWN AEROBIC AND ANAEROBIC 10 CC EACH  Final   Culture   Final    NO GROWTH < 24 HOURS Performed at [facility]    Report Status PENDING  Incomplete     Studies: No results found.  Scheduled Meds: . diphenhydrAMINE  25 mg Oral Once  . insulin aspart  0-9 Units Subcutaneous TID WC  . simvastatin  20 mg Oral QHS  . sodium chloride flush  3 mL Intravenous Q12H  . tamsulosin  0.4 mg Oral Daily  . vancomycin  750 mg Intravenous Q12H   Continuous Infusions:   Principal Problem:   Sepsis ([place]) Active Problems:   Postoperative anemia due to acute blood loss   UTI (lower urinary tract infection)   Hematuria   AKI (acute kidney injury) ([place])    Time spent: 35 minutes    Kelvin Cellar  [facility] Pager [phone]. If 7PM-7AM, please contact night-coverage at [email or web address], password [facility] 01/12/2016, 2:56 PM  LOS: 3 days

## 2016-01-14 LAB — TYPE AND SCREEN
ABO/RH(D): A POS
ANTIBODY SCREEN: NEGATIVE
UNIT DIVISION: 0
UNIT DIVISION: 0
Unit division: 0
Unit division: 0

## 2016-01-16 LAB — CULTURE, BLOOD (ROUTINE X 2)
CULTURE: NO GROWTH
CULTURE: NO GROWTH

## 2016-01-17 ENCOUNTER — Encounter (HOSPITAL_COMMUNITY): Payer: Self-pay | Admitting: Cardiovascular Disease

## 2016-01-19 DIAGNOSIS — N401 Enlarged prostate with lower urinary tract symptoms: Secondary | ICD-10-CM | POA: Diagnosis not present

## 2016-01-19 DIAGNOSIS — Z0000 Encounter for general adult medical examination without abnormal findings: Secondary | ICD-10-CM | POA: Diagnosis not present

## 2016-01-20 DIAGNOSIS — E1165 Type 2 diabetes mellitus with hyperglycemia: Secondary | ICD-10-CM | POA: Diagnosis not present

## 2016-01-20 DIAGNOSIS — I10 Essential (primary) hypertension: Secondary | ICD-10-CM | POA: Diagnosis not present

## 2016-01-25 DIAGNOSIS — C61 Malignant neoplasm of prostate: Secondary | ICD-10-CM | POA: Diagnosis not present

## 2016-01-25 DIAGNOSIS — E7800 Pure hypercholesterolemia, unspecified: Secondary | ICD-10-CM | POA: Diagnosis not present

## 2016-01-25 DIAGNOSIS — E1165 Type 2 diabetes mellitus with hyperglycemia: Secondary | ICD-10-CM | POA: Diagnosis not present

## 2016-01-25 DIAGNOSIS — I10 Essential (primary) hypertension: Secondary | ICD-10-CM | POA: Diagnosis not present

## 2016-03-16 DIAGNOSIS — R3915 Urgency of urination: Secondary | ICD-10-CM | POA: Diagnosis not present

## 2016-04-25 DIAGNOSIS — E1165 Type 2 diabetes mellitus with hyperglycemia: Secondary | ICD-10-CM | POA: Diagnosis not present

## 2016-04-25 DIAGNOSIS — I10 Essential (primary) hypertension: Secondary | ICD-10-CM | POA: Diagnosis not present

## 2016-04-28 DIAGNOSIS — E7800 Pure hypercholesterolemia, unspecified: Secondary | ICD-10-CM | POA: Diagnosis not present

## 2016-04-28 DIAGNOSIS — E1165 Type 2 diabetes mellitus with hyperglycemia: Secondary | ICD-10-CM | POA: Diagnosis not present

## 2016-04-28 DIAGNOSIS — I10 Essential (primary) hypertension: Secondary | ICD-10-CM | POA: Diagnosis not present

## 2016-05-01 DIAGNOSIS — E119 Type 2 diabetes mellitus without complications: Secondary | ICD-10-CM | POA: Diagnosis not present

## 2016-05-01 DIAGNOSIS — H2513 Age-related nuclear cataract, bilateral: Secondary | ICD-10-CM | POA: Diagnosis not present

## 2016-05-01 DIAGNOSIS — H40013 Open angle with borderline findings, low risk, bilateral: Secondary | ICD-10-CM | POA: Diagnosis not present

## 2016-05-01 DIAGNOSIS — H25013 Cortical age-related cataract, bilateral: Secondary | ICD-10-CM | POA: Diagnosis not present

## 2016-07-09 DIAGNOSIS — Z23 Encounter for immunization: Secondary | ICD-10-CM | POA: Diagnosis not present

## 2016-08-22 DIAGNOSIS — I10 Essential (primary) hypertension: Secondary | ICD-10-CM | POA: Diagnosis not present

## 2016-08-22 DIAGNOSIS — E1165 Type 2 diabetes mellitus with hyperglycemia: Secondary | ICD-10-CM | POA: Diagnosis not present

## 2016-08-29 DIAGNOSIS — E7800 Pure hypercholesterolemia, unspecified: Secondary | ICD-10-CM | POA: Diagnosis not present

## 2016-08-29 DIAGNOSIS — Z125 Encounter for screening for malignant neoplasm of prostate: Secondary | ICD-10-CM | POA: Diagnosis not present

## 2016-08-29 DIAGNOSIS — E1165 Type 2 diabetes mellitus with hyperglycemia: Secondary | ICD-10-CM | POA: Diagnosis not present

## 2016-08-29 DIAGNOSIS — I10 Essential (primary) hypertension: Secondary | ICD-10-CM | POA: Diagnosis not present

## 2016-09-13 DIAGNOSIS — N5201 Erectile dysfunction due to arterial insufficiency: Secondary | ICD-10-CM | POA: Diagnosis not present

## 2016-09-13 DIAGNOSIS — C61 Malignant neoplasm of prostate: Secondary | ICD-10-CM | POA: Diagnosis not present

## 2016-09-13 DIAGNOSIS — N400 Enlarged prostate without lower urinary tract symptoms: Secondary | ICD-10-CM | POA: Diagnosis not present

## 2017-02-02 DIAGNOSIS — T300 Burn of unspecified body region, unspecified degree: Secondary | ICD-10-CM | POA: Diagnosis not present

## 2017-02-12 DIAGNOSIS — T23262A Burn of second degree of back of left hand, initial encounter: Secondary | ICD-10-CM | POA: Diagnosis not present

## 2017-02-12 DIAGNOSIS — T310 Burns involving less than 10% of body surface: Secondary | ICD-10-CM | POA: Diagnosis not present

## 2017-02-12 DIAGNOSIS — E119 Type 2 diabetes mellitus without complications: Secondary | ICD-10-CM | POA: Diagnosis not present

## 2017-02-12 DIAGNOSIS — T23261A Burn of second degree of back of right hand, initial encounter: Secondary | ICD-10-CM | POA: Diagnosis not present

## 2017-02-12 DIAGNOSIS — Z7984 Long term (current) use of oral hypoglycemic drugs: Secondary | ICD-10-CM | POA: Diagnosis not present

## 2017-02-12 DIAGNOSIS — T2220XA Burn of second degree of shoulder and upper limb, except wrist and hand, unspecified site, initial encounter: Secondary | ICD-10-CM | POA: Diagnosis not present

## 2017-02-12 DIAGNOSIS — L299 Pruritus, unspecified: Secondary | ICD-10-CM | POA: Diagnosis not present

## 2017-02-12 DIAGNOSIS — T22211A Burn of second degree of right forearm, initial encounter: Secondary | ICD-10-CM | POA: Diagnosis not present

## 2017-02-12 DIAGNOSIS — W868XXA Exposure to other electric current, initial encounter: Secondary | ICD-10-CM | POA: Diagnosis not present

## 2017-02-16 DIAGNOSIS — E1165 Type 2 diabetes mellitus with hyperglycemia: Secondary | ICD-10-CM | POA: Diagnosis not present

## 2017-02-16 DIAGNOSIS — I10 Essential (primary) hypertension: Secondary | ICD-10-CM | POA: Diagnosis not present

## 2017-02-16 DIAGNOSIS — E7800 Pure hypercholesterolemia, unspecified: Secondary | ICD-10-CM | POA: Diagnosis not present

## 2017-02-16 DIAGNOSIS — T300 Burn of unspecified body region, unspecified degree: Secondary | ICD-10-CM | POA: Diagnosis not present

## 2017-03-05 DIAGNOSIS — Z87891 Personal history of nicotine dependence: Secondary | ICD-10-CM | POA: Diagnosis not present

## 2017-03-05 DIAGNOSIS — E119 Type 2 diabetes mellitus without complications: Secondary | ICD-10-CM | POA: Diagnosis not present

## 2017-03-05 DIAGNOSIS — T310 Burns involving less than 10% of body surface: Secondary | ICD-10-CM | POA: Diagnosis not present

## 2017-03-05 DIAGNOSIS — Z7984 Long term (current) use of oral hypoglycemic drugs: Secondary | ICD-10-CM | POA: Diagnosis not present

## 2017-03-05 DIAGNOSIS — Z09 Encounter for follow-up examination after completed treatment for conditions other than malignant neoplasm: Secondary | ICD-10-CM | POA: Diagnosis not present

## 2017-03-05 DIAGNOSIS — T23262D Burn of second degree of back of left hand, subsequent encounter: Secondary | ICD-10-CM | POA: Diagnosis not present

## 2017-03-07 DIAGNOSIS — C61 Malignant neoplasm of prostate: Secondary | ICD-10-CM | POA: Diagnosis not present

## 2017-03-12 DIAGNOSIS — I10 Essential (primary) hypertension: Secondary | ICD-10-CM | POA: Diagnosis not present

## 2017-03-12 DIAGNOSIS — E1165 Type 2 diabetes mellitus with hyperglycemia: Secondary | ICD-10-CM | POA: Diagnosis not present

## 2017-03-12 DIAGNOSIS — Z125 Encounter for screening for malignant neoplasm of prostate: Secondary | ICD-10-CM | POA: Diagnosis not present

## 2017-03-14 DIAGNOSIS — C61 Malignant neoplasm of prostate: Secondary | ICD-10-CM | POA: Diagnosis not present

## 2017-03-14 DIAGNOSIS — N400 Enlarged prostate without lower urinary tract symptoms: Secondary | ICD-10-CM | POA: Diagnosis not present

## 2017-03-14 DIAGNOSIS — N5201 Erectile dysfunction due to arterial insufficiency: Secondary | ICD-10-CM | POA: Diagnosis not present

## 2017-03-19 DIAGNOSIS — E1165 Type 2 diabetes mellitus with hyperglycemia: Secondary | ICD-10-CM | POA: Diagnosis not present

## 2017-03-19 DIAGNOSIS — I10 Essential (primary) hypertension: Secondary | ICD-10-CM | POA: Diagnosis not present

## 2017-03-19 DIAGNOSIS — E7800 Pure hypercholesterolemia, unspecified: Secondary | ICD-10-CM | POA: Diagnosis not present

## 2017-05-10 DIAGNOSIS — H2513 Age-related nuclear cataract, bilateral: Secondary | ICD-10-CM | POA: Diagnosis not present

## 2017-05-10 DIAGNOSIS — H40013 Open angle with borderline findings, low risk, bilateral: Secondary | ICD-10-CM | POA: Diagnosis not present

## 2017-05-10 DIAGNOSIS — E119 Type 2 diabetes mellitus without complications: Secondary | ICD-10-CM | POA: Diagnosis not present

## 2017-05-10 DIAGNOSIS — H25013 Cortical age-related cataract, bilateral: Secondary | ICD-10-CM | POA: Diagnosis not present

## 2017-06-10 DIAGNOSIS — Z23 Encounter for immunization: Secondary | ICD-10-CM | POA: Diagnosis not present

## 2017-06-14 DIAGNOSIS — E1165 Type 2 diabetes mellitus with hyperglycemia: Secondary | ICD-10-CM | POA: Diagnosis not present

## 2017-06-14 DIAGNOSIS — I10 Essential (primary) hypertension: Secondary | ICD-10-CM | POA: Diagnosis not present

## 2017-06-21 DIAGNOSIS — E7800 Pure hypercholesterolemia, unspecified: Secondary | ICD-10-CM | POA: Diagnosis not present

## 2017-06-21 DIAGNOSIS — Z125 Encounter for screening for malignant neoplasm of prostate: Secondary | ICD-10-CM | POA: Diagnosis not present

## 2017-06-21 DIAGNOSIS — E1165 Type 2 diabetes mellitus with hyperglycemia: Secondary | ICD-10-CM | POA: Diagnosis not present

## 2017-06-21 DIAGNOSIS — I10 Essential (primary) hypertension: Secondary | ICD-10-CM | POA: Diagnosis not present

## 2017-07-25 ENCOUNTER — Other Ambulatory Visit: Payer: Self-pay | Admitting: Physician Assistant

## 2017-07-25 DIAGNOSIS — L570 Actinic keratosis: Secondary | ICD-10-CM | POA: Diagnosis not present

## 2017-07-25 DIAGNOSIS — C44711 Basal cell carcinoma of skin of unspecified lower limb, including hip: Secondary | ICD-10-CM | POA: Diagnosis not present

## 2017-07-25 DIAGNOSIS — D492 Neoplasm of unspecified behavior of bone, soft tissue, and skin: Secondary | ICD-10-CM | POA: Diagnosis not present

## 2017-07-25 DIAGNOSIS — C44612 Basal cell carcinoma of skin of right upper limb, including shoulder: Secondary | ICD-10-CM | POA: Diagnosis not present

## 2017-09-06 DIAGNOSIS — C44612 Basal cell carcinoma of skin of right upper limb, including shoulder: Secondary | ICD-10-CM | POA: Diagnosis not present

## 2017-12-05 DIAGNOSIS — L570 Actinic keratosis: Secondary | ICD-10-CM | POA: Diagnosis not present

## 2018-01-07 DIAGNOSIS — I10 Essential (primary) hypertension: Secondary | ICD-10-CM | POA: Diagnosis not present

## 2018-01-07 DIAGNOSIS — E1165 Type 2 diabetes mellitus with hyperglycemia: Secondary | ICD-10-CM | POA: Diagnosis not present

## 2018-01-07 DIAGNOSIS — Z125 Encounter for screening for malignant neoplasm of prostate: Secondary | ICD-10-CM | POA: Diagnosis not present

## 2018-01-14 DIAGNOSIS — I10 Essential (primary) hypertension: Secondary | ICD-10-CM | POA: Diagnosis not present

## 2018-01-14 DIAGNOSIS — E7800 Pure hypercholesterolemia, unspecified: Secondary | ICD-10-CM | POA: Diagnosis not present

## 2018-01-14 DIAGNOSIS — E1165 Type 2 diabetes mellitus with hyperglycemia: Secondary | ICD-10-CM | POA: Diagnosis not present

## 2018-01-14 DIAGNOSIS — Z125 Encounter for screening for malignant neoplasm of prostate: Secondary | ICD-10-CM | POA: Diagnosis not present

## 2018-03-19 ENCOUNTER — Other Ambulatory Visit: Payer: Self-pay | Admitting: Physician Assistant

## 2018-03-19 DIAGNOSIS — L570 Actinic keratosis: Secondary | ICD-10-CM | POA: Diagnosis not present

## 2018-03-19 DIAGNOSIS — C44529 Squamous cell carcinoma of skin of other part of trunk: Secondary | ICD-10-CM | POA: Diagnosis not present

## 2018-03-19 DIAGNOSIS — D485 Neoplasm of uncertain behavior of skin: Secondary | ICD-10-CM | POA: Diagnosis not present

## 2018-03-29 DIAGNOSIS — N401 Enlarged prostate with lower urinary tract symptoms: Secondary | ICD-10-CM | POA: Diagnosis not present

## 2018-04-05 DIAGNOSIS — C61 Malignant neoplasm of prostate: Secondary | ICD-10-CM | POA: Diagnosis not present

## 2018-04-05 DIAGNOSIS — N400 Enlarged prostate without lower urinary tract symptoms: Secondary | ICD-10-CM | POA: Diagnosis not present

## 2018-04-05 DIAGNOSIS — N200 Calculus of kidney: Secondary | ICD-10-CM | POA: Diagnosis not present

## 2018-04-08 DIAGNOSIS — N200 Calculus of kidney: Secondary | ICD-10-CM | POA: Diagnosis not present

## 2018-04-12 DIAGNOSIS — N200 Calculus of kidney: Secondary | ICD-10-CM | POA: Diagnosis not present

## 2018-04-25 ENCOUNTER — Other Ambulatory Visit: Payer: Self-pay | Admitting: Physician Assistant

## 2018-04-25 DIAGNOSIS — C44529 Squamous cell carcinoma of skin of other part of trunk: Secondary | ICD-10-CM | POA: Diagnosis not present

## 2018-04-25 DIAGNOSIS — C44311 Basal cell carcinoma of skin of nose: Secondary | ICD-10-CM | POA: Diagnosis not present

## 2018-06-07 DIAGNOSIS — Z23 Encounter for immunization: Secondary | ICD-10-CM | POA: Diagnosis not present

## 2018-07-15 ENCOUNTER — Encounter: Payer: Self-pay | Admitting: Cardiology

## 2018-07-15 DIAGNOSIS — I10 Essential (primary) hypertension: Secondary | ICD-10-CM | POA: Diagnosis not present

## 2018-07-15 DIAGNOSIS — Z125 Encounter for screening for malignant neoplasm of prostate: Secondary | ICD-10-CM | POA: Diagnosis not present

## 2018-07-15 DIAGNOSIS — E1165 Type 2 diabetes mellitus with hyperglycemia: Secondary | ICD-10-CM | POA: Diagnosis not present

## 2018-07-23 ENCOUNTER — Other Ambulatory Visit: Payer: Self-pay | Admitting: Physician Assistant

## 2018-07-23 DIAGNOSIS — I2510 Atherosclerotic heart disease of native coronary artery without angina pectoris: Secondary | ICD-10-CM | POA: Diagnosis not present

## 2018-07-23 DIAGNOSIS — I10 Essential (primary) hypertension: Secondary | ICD-10-CM | POA: Diagnosis not present

## 2018-07-23 DIAGNOSIS — R972 Elevated prostate specific antigen [PSA]: Secondary | ICD-10-CM | POA: Diagnosis not present

## 2018-07-23 DIAGNOSIS — C44629 Squamous cell carcinoma of skin of left upper limb, including shoulder: Secondary | ICD-10-CM | POA: Diagnosis not present

## 2018-07-23 DIAGNOSIS — Z0000 Encounter for general adult medical examination without abnormal findings: Secondary | ICD-10-CM | POA: Diagnosis not present

## 2018-07-23 DIAGNOSIS — D649 Anemia, unspecified: Secondary | ICD-10-CM | POA: Diagnosis not present

## 2018-07-23 DIAGNOSIS — E7800 Pure hypercholesterolemia, unspecified: Secondary | ICD-10-CM | POA: Diagnosis not present

## 2018-07-23 DIAGNOSIS — L570 Actinic keratosis: Secondary | ICD-10-CM | POA: Diagnosis not present

## 2018-07-23 DIAGNOSIS — E1165 Type 2 diabetes mellitus with hyperglycemia: Secondary | ICD-10-CM | POA: Diagnosis not present

## 2018-07-24 ENCOUNTER — Telehealth: Payer: Self-pay | Admitting: Cardiology

## 2018-07-24 NOTE — Telephone Encounter (Signed)
Received records from [facility] on 07/24/18, Appt 08/08/18 @ 8:40AM. [place]

## 2018-08-05 NOTE — Progress Notes (Signed)
Cardiology Office Note   Date:  08/08/2018   ID:  Kevin Clements, DOB 1940/12/03, MRN [id number]  PCP:  Janie Morning, DO  Cardiologist:   Zahir Eisenhour Martinique, MD   Chief Complaint  Patient presents with  . Coronary Artery Disease      History of Present Illness: Kevin Clements is a 77 y.o. male who is seen at the request of Dr. Theda Sers for evaluation of coronary calcification. He  Has a history of DM type 2, HTN, HLD, and CKD.   He was admitted in 2017 with sepsis following TURP. Grew enterococcus in his blood. Determined not to have SBE. TEE noted below.   He has been enrolled in a study at [facility] and had a baseline CT chest done. This showed extensive coronary calcification. His only other cardiac evaluation was > 20 years ago with a stress test in [place].   He is active and walks 2.5 miles per day. Denies any chest pain, SOB, palpitations, TIA or CVA symptoms or claudication.    Past Medical History:  Diagnosis Date  . BPH (benign prostatic hyperplasia)   . Cancer ([place])    hx skin cancer  . Diabetes mellitus without complication ([place])   . Elevated serum creatinine   . History of bladder stone   . History of kidney stones   . History of skin cancer   . History of UTI   . Hyperlipidemia   . Hypertension     Past Surgical History:  Procedure Laterality Date  . cyst removed  2012   from back  . CYSTOSCOPY  2012   to remove kidney stone and bladder stone  . CYSTOSCOPY WITH LITHOLAPAXY N/A 01/06/2016   Procedure: CYSTOSCOPY WITH LITHOLAPAXY WITH Jobe Gibbon;  Surgeon: Franchot Gallo, MD;  Location: [facility];  Service: Urology;  Laterality: N/A;  . HAND SURGERY  2005 /2011   bilateral  . TEE WITHOUT CARDIOVERSION N/A 01/13/2016   Procedure: TRANSESOPHAGEAL ECHOCARDIOGRAM (TEE);  Surgeon: Herminio Commons, MD;  Location: [facility];  Service: Cardiovascular;  Laterality: N/A;  . TONSILLECTOMY    . TRANSURETHRAL RESECTION OF PROSTATE N/A 01/06/2016   Procedure:  TRANSURETHRAL RESECTION OF THE PROSTATE WITH GYRUS INSTRUMENTS;  Surgeon: Franchot Gallo, MD;  Location: [facility];  Service: Urology;  Laterality: N/A;     Current Outpatient Medications  Medication Sig Dispense Refill  . amLODipine (NORVASC) 5 MG tablet Take 5 mg by mouth every morning.    . carvedilol (COREG) 3.125 MG tablet Take 3.125 mg by mouth 2 (two) times daily.    . Cholecalciferol (VITAMIN D) 2000 units CAPS Take 1 capsule by mouth 2 (two) times daily.    Marland Kitchen CINNAMON PO Take 1,000 mg by mouth 2 (two) times daily.    Marland Kitchen glimepiride (AMARYL) 4 MG tablet Take 4 mg by mouth 2 (two) times daily.    . metFORMIN (GLUCOPHAGE-XR) 750 MG 24 hr tablet Take 1 tablet by mouth daily.    . Multiple Vitamin (MULTIVITAMIN WITH MINERALS) TABS tablet Take 1 tablet by mouth every morning.    . Omega-3 Fatty Acids (FISH OIL) 1200 MG CAPS Take 1 capsule by mouth every morning.    . pioglitazone (ACTOS) 30 MG tablet Take 30 mg by mouth at bedtime.    . simvastatin (ZOCOR) 20 MG tablet Take 20 mg by mouth at bedtime.    Marland Kitchen aspirin EC 81 MG tablet Take 1 tablet (81 mg total) by mouth daily. 90 tablet 3   No  current facility-administered medications for this visit.     Allergies:   Patient has no known allergies.    Social History:  The patient  reports that he has never smoked. He has never used smokeless tobacco. He reports that he drinks alcohol. He reports that he does not use drugs.   Family History:  The patient's family history includes Colon cancer in his mother; Diabetes in his sister; Heart failure in his father; Pancreatic cancer in his mother.    ROS:  Please see the history of present illness.   Otherwise, review of systems are positive for none.   All other systems are reviewed and negative.    PHYSICAL EXAM: VS:  BP 128/86   Pulse 61   Ht 6\' 1"  (1.854 m)   Wt 217 lb 9.6 oz (98.7 kg)   BMI 28.71 kg/m  , BMI Body mass index is 28.71 kg/m. GEN: Well nourished, well developed, in no  acute distress  HEENT: normal  Neck: no JVD, left carotid bruit, or masses Cardiac: RRR; no murmurs, rubs, or gallops,no edema. Peripheral pulses 2+ Respiratory:  clear to auscultation bilaterally, normal work of breathing GI: soft, nontender, nondistended, + BS MS: no deformity or atrophy  Skin: warm and dry, no rash Neuro:  Strength and sensation are intact Psych: euthymic mood, full affect   EKG:  EKG is ordered today. The ekg ordered today demonstrates NSR rate 61, incomplete RBBB. I have personally reviewed and interpreted this study.    Recent Labs: No results found for requested labs within last 8760 hours.    Lipid Panel No results found for: CHOL, TRIG, HDL, CHOLHDL, VLDL, LDLCALC, LDLDIRECT    Wt Readings from Last 3 Encounters:  08/08/18 217 lb 9.6 oz (98.7 kg)  01/10/16 210 lb 8 oz (95.5 kg)  01/06/16 216 lb (98 kg)    Labs dated 01/07/18: cholesterol 142, triglycerides 78, HDL 51, LDL 75. A1c 6.9%. Creatinine 1.4. Other chemistries and CBC normal.    Other studies Reviewed: Additional studies/ records that were reviewed today include:   Echo 01/12/16: Study Conclusions  - Left ventricle: The cavity size was normal. There was mild focal   basal hypertrophy of the septum. Systolic function was normal.   The estimated ejection fraction was in the range of 55% to 60%.   Wall motion was normal; there were no regional wall motion   abnormalities. - Aortic valve: Trileaflet; mildly thickened, mildly calcified   leaflets. - Mitral valve: There is a thin mobile density off of the MV   apparatus that is worrisome for vegetation but could be Redundant   chordae tendinae. Recommend TEE For further evaluation. Calcified   annulus. - Recommendations: There is a thin mobile density off of the MV   apparatus that is worrisome for vegetation but could be Redundant   chordae tendinae. Recommend TEE For further evaluation.  Recommendations:  There is a thin mobile  density off of the MV apparatus that is worrisome for vegetation but could be Redundant chordae tendinae. Recommend TEE For further evaluation.  TEE 01/13/16: Study Conclusions  - Left ventricle: Systolic function was normal. The estimated   ejection fraction was in the range of 55% to 60%. Wall motion was   normal; there were no regional wall motion abnormalities. - Aortic valve: Trileaflet; mildly thickened leaflets. No   vegetation. - Mitral valve: Redundant chordae tendinae attached to anterior   mitral leaflet. No vegetations. - Left atrium: No evidence of thrombus in  the atrial cavity or   appendage. Normal intra-appendage velocities by pulsed Doppler. - Tricuspid valve: No evidence of vegetation. - Pulmonic valve: No evidence of vegetation. - Superior vena cava: The study excluded a thrombus.  Impressions:  - There was no evidence of a vegetation.  ASSESSMENT AND PLAN:  1. CAD with extensive coronary calcification. He is asymptomatic but has multiple cardiac risk factors including DM, HTN, HLD. Recommend stress Myoview study to assess risk of ischemia. If low risk would recommend continued aggressive risk factor modification. If higher risk will need cardiac cath.  2. Left carotid bruit. Asymptomatic. Will obtain carotid dopplers.  3. DM type 2 with CKD 4. HTN 5. HLD on statin. Goal LDL < 70 6. CKD.    Current medicines are reviewed at length with the patient today.  The patient does not have concerns regarding medicines.  The following changes have been made:  no change  Labs/ tests ordered today include: Carotid doppler  Orders Placed This Encounter  Procedures  . Myocardial Perfusion Imaging  . EKG 12-Lead     Disposition:   FU with me pending above results.   Signed, Francisco Eyerly Martinique, MD  08/08/2018 9:15 AM    [facility] [address], [place], [place], [address] Phone [phone], Fax [phone]

## 2018-08-08 ENCOUNTER — Encounter: Payer: Self-pay | Admitting: Cardiology

## 2018-08-08 ENCOUNTER — Other Ambulatory Visit: Payer: Self-pay | Admitting: Cardiology

## 2018-08-08 ENCOUNTER — Ambulatory Visit (INDEPENDENT_AMBULATORY_CARE_PROVIDER_SITE_OTHER): Payer: Medicare Other | Admitting: Cardiology

## 2018-08-08 VITALS — BP 128/86 | HR 61 | Ht 73.0 in | Wt 217.6 lb

## 2018-08-08 DIAGNOSIS — I10 Essential (primary) hypertension: Secondary | ICD-10-CM

## 2018-08-08 DIAGNOSIS — I2584 Coronary atherosclerosis due to calcified coronary lesion: Secondary | ICD-10-CM

## 2018-08-08 DIAGNOSIS — E118 Type 2 diabetes mellitus with unspecified complications: Secondary | ICD-10-CM | POA: Diagnosis not present

## 2018-08-08 DIAGNOSIS — I2510 Atherosclerotic heart disease of native coronary artery without angina pectoris: Secondary | ICD-10-CM | POA: Diagnosis not present

## 2018-08-08 DIAGNOSIS — R0989 Other specified symptoms and signs involving the circulatory and respiratory systems: Secondary | ICD-10-CM

## 2018-08-08 MED ORDER — ASPIRIN EC 81 MG PO TBEC: 81.0000 mg | DELAYED_RELEASE_TABLET | Freq: Every day | ORAL | 3 refills | Status: AC

## 2018-08-08 NOTE — Patient Instructions (Signed)
We will schedule you for a nuclear stress test and carotid dopplers.  Continue your current medication

## 2018-08-12 ENCOUNTER — Telehealth: Payer: Self-pay

## 2018-08-12 NOTE — Telephone Encounter (Signed)
Sent referral to scheduling and filed notes 

## 2018-08-13 ENCOUNTER — Telehealth (HOSPITAL_COMMUNITY): Payer: Self-pay

## 2018-08-13 NOTE — Telephone Encounter (Signed)
Encounter complete. 

## 2018-08-15 ENCOUNTER — Ambulatory Visit (HOSPITAL_COMMUNITY)
Admission: RE | Admit: 2018-08-15 | Discharge: 2018-08-15 | Disposition: A | Discharge status: Home / Self Care | Payer: Medicare Other | Source: Ambulatory Visit | Attending: Cardiology | Admitting: Cardiology

## 2018-08-15 ENCOUNTER — Ambulatory Visit (HOSPITAL_BASED_OUTPATIENT_CLINIC_OR_DEPARTMENT_OTHER)
Admission: RE | Admit: 2018-08-15 | Discharge: 2018-08-15 | Disposition: A | Discharge status: Home / Self Care | Payer: Medicare Other | Source: Ambulatory Visit | Attending: Cardiology | Admitting: Cardiology

## 2018-08-15 DIAGNOSIS — I2510 Atherosclerotic heart disease of native coronary artery without angina pectoris: Secondary | ICD-10-CM | POA: Diagnosis not present

## 2018-08-15 DIAGNOSIS — R0989 Other specified symptoms and signs involving the circulatory and respiratory systems: Secondary | ICD-10-CM | POA: Insufficient documentation

## 2018-08-15 DIAGNOSIS — I2584 Coronary atherosclerosis due to calcified coronary lesion: Secondary | ICD-10-CM | POA: Diagnosis not present

## 2018-08-15 DIAGNOSIS — E118 Type 2 diabetes mellitus with unspecified complications: Secondary | ICD-10-CM | POA: Insufficient documentation

## 2018-08-15 DIAGNOSIS — I10 Essential (primary) hypertension: Secondary | ICD-10-CM | POA: Diagnosis not present

## 2018-08-15 LAB — MYOCARDIAL PERFUSION IMAGING
CHL CUP NUCLEAR SRS: 0
CHL CUP NUCLEAR SSS: 0
CSEPEDS: 30 s
CSEPEW: 9.3 METS
Exercise duration (min): 7 min
LV dias vol: 119 mL (ref 62–150)
LVSYSVOL: 49 mL
MPHR: 145 {beats}/min
NUC STRESS TID: 1.07
Peak HR: 137 {beats}/min
Percent HR: 94 %
RPE: 19
Rest HR: 59 {beats}/min
SDS: 0

## 2018-08-15 MED ORDER — TECHNETIUM TC 99M TETROFOSMIN IV KIT
31.0000 | PACK | Freq: Once | INTRAVENOUS | Status: AC | PRN
  Administered 2018-08-15: 31 via INTRAVENOUS
  Filled 2018-08-15: qty 31

## 2018-08-15 MED ORDER — TECHNETIUM TC 99M TETROFOSMIN IV KIT
10.8000 | PACK | Freq: Once | INTRAVENOUS | Status: AC | PRN
  Administered 2018-08-15: 10.8 via INTRAVENOUS
  Filled 2018-08-15: qty 11

## 2018-09-17 ENCOUNTER — Other Ambulatory Visit: Payer: Self-pay | Admitting: Physician Assistant

## 2018-09-17 DIAGNOSIS — D0462 Carcinoma in situ of skin of left upper limb, including shoulder: Secondary | ICD-10-CM | POA: Diagnosis not present

## 2019-02-19 ENCOUNTER — Other Ambulatory Visit: Payer: Self-pay

## 2019-02-19 NOTE — Patient Outreach (Signed)
[facility]) Care Management  02/19/2019  Bain Whichard 24-Aug-1941 [id number]   Patient screened for HTA Health Risk Assessment.  Requested Advance Directive documentation to be mailed. Advance Directive packet and EMMI mailed today. Will follow up within the next month to ensure receipt.   [name], BSW Social Worker [phone]

## 2019-03-06 DIAGNOSIS — E7800 Pure hypercholesterolemia, unspecified: Secondary | ICD-10-CM | POA: Diagnosis not present

## 2019-03-06 DIAGNOSIS — I10 Essential (primary) hypertension: Secondary | ICD-10-CM | POA: Diagnosis not present

## 2019-03-06 DIAGNOSIS — E1165 Type 2 diabetes mellitus with hyperglycemia: Secondary | ICD-10-CM | POA: Diagnosis not present

## 2019-03-06 DIAGNOSIS — Z0000 Encounter for general adult medical examination without abnormal findings: Secondary | ICD-10-CM | POA: Diagnosis not present

## 2019-03-14 DIAGNOSIS — E7800 Pure hypercholesterolemia, unspecified: Secondary | ICD-10-CM | POA: Diagnosis not present

## 2019-03-14 DIAGNOSIS — E1165 Type 2 diabetes mellitus with hyperglycemia: Secondary | ICD-10-CM | POA: Diagnosis not present

## 2019-03-14 DIAGNOSIS — I10 Essential (primary) hypertension: Secondary | ICD-10-CM | POA: Diagnosis not present

## 2019-04-01 ENCOUNTER — Other Ambulatory Visit: Payer: Self-pay

## 2019-04-01 NOTE — Patient Outreach (Signed)
[facility]) Care Management  04/01/2019  Kevin Clements 06-Dec-1940 [id number]   [facility] BSW closing case due to patient receiving other CM services.   [name], BSW Social Worker [phone]

## 2019-05-01 ENCOUNTER — Other Ambulatory Visit: Payer: Self-pay

## 2019-05-19 DIAGNOSIS — H25013 Cortical age-related cataract, bilateral: Secondary | ICD-10-CM | POA: Diagnosis not present

## 2019-05-19 DIAGNOSIS — H2513 Age-related nuclear cataract, bilateral: Secondary | ICD-10-CM | POA: Diagnosis not present

## 2019-05-19 DIAGNOSIS — E119 Type 2 diabetes mellitus without complications: Secondary | ICD-10-CM | POA: Diagnosis not present

## 2019-05-19 DIAGNOSIS — H40013 Open angle with borderline findings, low risk, bilateral: Secondary | ICD-10-CM | POA: Diagnosis not present

## 2019-06-16 DIAGNOSIS — Z20828 Contact with and (suspected) exposure to other viral communicable diseases: Secondary | ICD-10-CM | POA: Diagnosis not present

## 2019-09-03 DIAGNOSIS — Z800 Family history of malignant neoplasm of digestive organs: Secondary | ICD-10-CM | POA: Diagnosis not present

## 2019-09-03 DIAGNOSIS — Z86010 Personal history of colonic polyps: Secondary | ICD-10-CM | POA: Diagnosis not present

## 2019-10-14 ENCOUNTER — Ambulatory Visit: Payer: Medicare Other | Attending: Internal Medicine

## 2019-10-14 DIAGNOSIS — Z23 Encounter for immunization: Secondary | ICD-10-CM

## 2019-10-14 NOTE — Progress Notes (Signed)
   Covid-19 Vaccination Clinic  Name:  [name]    MRN: [id number] DOB: 05-Apr-1941  10/14/2019  [name] was observed post Covid-19 immunization for 15 minutes without incidence. He was provided with Vaccine Information Sheet and instruction to access the V-Safe system.   [name] was instructed to call 911 with any severe reactions post vaccine: Marland Kitchen Difficulty breathing  . Swelling of your face and throat  . A fast heartbeat  . A bad rash all over your body  . Dizziness and weakness    Immunizations Administered    Name Date Dose VIS Date Route   Pfizer COVID-19 Vaccine 10/14/2019  8:31 AM 0.3 mL 09/12/2019 Intramuscular   Manufacturer: [place]   Lot: [id number]   [place]: [id number]

## 2019-10-22 DIAGNOSIS — E7800 Pure hypercholesterolemia, unspecified: Secondary | ICD-10-CM | POA: Diagnosis not present

## 2019-10-22 DIAGNOSIS — I10 Essential (primary) hypertension: Secondary | ICD-10-CM | POA: Diagnosis not present

## 2019-10-22 DIAGNOSIS — R739 Hyperglycemia, unspecified: Secondary | ICD-10-CM | POA: Diagnosis not present

## 2019-10-28 DIAGNOSIS — Z0000 Encounter for general adult medical examination without abnormal findings: Secondary | ICD-10-CM | POA: Diagnosis not present

## 2019-10-28 DIAGNOSIS — I10 Essential (primary) hypertension: Secondary | ICD-10-CM | POA: Diagnosis not present

## 2019-10-28 DIAGNOSIS — E1165 Type 2 diabetes mellitus with hyperglycemia: Secondary | ICD-10-CM | POA: Diagnosis not present

## 2019-10-28 DIAGNOSIS — E7800 Pure hypercholesterolemia, unspecified: Secondary | ICD-10-CM | POA: Diagnosis not present

## 2019-10-30 DIAGNOSIS — Z1159 Encounter for screening for other viral diseases: Secondary | ICD-10-CM | POA: Diagnosis not present

## 2019-11-01 ENCOUNTER — Ambulatory Visit: Payer: PPO | Attending: Internal Medicine

## 2019-11-01 ENCOUNTER — Ambulatory Visit: Payer: PPO

## 2019-11-01 DIAGNOSIS — Z23 Encounter for immunization: Secondary | ICD-10-CM | POA: Insufficient documentation

## 2019-11-01 NOTE — Progress Notes (Signed)
   Covid-19 Vaccination Clinic  Name:  Kevin Clements    MRN: [id number] DOB: 04-10-1941  11/01/2019  [name] was observed post Covid-19 immunization for 15 minutes without incidence. He was provided with Vaccine Information Sheet and instruction to access the V-Safe system.   [name] was instructed to call 911 with any severe reactions post vaccine: Marland Kitchen Difficulty breathing  . Swelling of your face and throat  . A fast heartbeat  . A bad rash all over your body  . Dizziness and weakness    Immunizations Administered    Name Date Dose VIS Date Route   Pfizer COVID-19 Vaccine 11/01/2019 11:16 AM 0.3 mL 09/12/2019 Intramuscular   Manufacturer: [place]   Lot: [id number]   [place]: [id number]

## 2019-11-04 DIAGNOSIS — D123 Benign neoplasm of transverse colon: Secondary | ICD-10-CM | POA: Diagnosis not present

## 2019-11-04 DIAGNOSIS — Z86010 Personal history of colonic polyps: Secondary | ICD-10-CM | POA: Diagnosis not present

## 2019-11-04 DIAGNOSIS — Z800 Family history of malignant neoplasm of digestive organs: Secondary | ICD-10-CM | POA: Diagnosis not present

## 2019-11-07 DIAGNOSIS — D123 Benign neoplasm of transverse colon: Secondary | ICD-10-CM | POA: Diagnosis not present

## 2020-04-20 DIAGNOSIS — I10 Essential (primary) hypertension: Secondary | ICD-10-CM | POA: Diagnosis not present

## 2020-04-20 DIAGNOSIS — Z0000 Encounter for general adult medical examination without abnormal findings: Secondary | ICD-10-CM | POA: Diagnosis not present

## 2020-04-20 DIAGNOSIS — E1165 Type 2 diabetes mellitus with hyperglycemia: Secondary | ICD-10-CM | POA: Diagnosis not present

## 2020-05-03 DIAGNOSIS — Z8546 Personal history of malignant neoplasm of prostate: Secondary | ICD-10-CM | POA: Diagnosis not present

## 2020-05-03 DIAGNOSIS — Z0000 Encounter for general adult medical examination without abnormal findings: Secondary | ICD-10-CM | POA: Diagnosis not present

## 2020-05-03 DIAGNOSIS — E1140 Type 2 diabetes mellitus with diabetic neuropathy, unspecified: Secondary | ICD-10-CM | POA: Diagnosis not present

## 2020-05-03 DIAGNOSIS — I10 Essential (primary) hypertension: Secondary | ICD-10-CM | POA: Diagnosis not present

## 2020-05-03 DIAGNOSIS — E1122 Type 2 diabetes mellitus with diabetic chronic kidney disease: Secondary | ICD-10-CM | POA: Diagnosis not present

## 2020-05-20 DIAGNOSIS — H25013 Cortical age-related cataract, bilateral: Secondary | ICD-10-CM | POA: Diagnosis not present

## 2020-05-20 DIAGNOSIS — H40013 Open angle with borderline findings, low risk, bilateral: Secondary | ICD-10-CM | POA: Diagnosis not present

## 2020-05-20 DIAGNOSIS — H5213 Myopia, bilateral: Secondary | ICD-10-CM | POA: Diagnosis not present

## 2020-05-20 DIAGNOSIS — E119 Type 2 diabetes mellitus without complications: Secondary | ICD-10-CM | POA: Diagnosis not present

## 2020-05-20 DIAGNOSIS — H2513 Age-related nuclear cataract, bilateral: Secondary | ICD-10-CM | POA: Diagnosis not present

## 2020-07-14 DIAGNOSIS — S51812A Laceration without foreign body of left forearm, initial encounter: Secondary | ICD-10-CM | POA: Diagnosis not present

## 2020-07-15 DIAGNOSIS — S41119A Laceration without foreign body of unspecified upper arm, initial encounter: Secondary | ICD-10-CM | POA: Diagnosis not present

## 2020-07-23 DIAGNOSIS — S41119A Laceration without foreign body of unspecified upper arm, initial encounter: Secondary | ICD-10-CM | POA: Diagnosis not present

## 2020-07-23 DIAGNOSIS — Z4802 Encounter for removal of sutures: Secondary | ICD-10-CM | POA: Diagnosis not present

## 2020-11-09 DIAGNOSIS — E7800 Pure hypercholesterolemia, unspecified: Secondary | ICD-10-CM | POA: Diagnosis not present

## 2020-11-09 DIAGNOSIS — I10 Essential (primary) hypertension: Secondary | ICD-10-CM | POA: Diagnosis not present

## 2020-11-09 DIAGNOSIS — E1122 Type 2 diabetes mellitus with diabetic chronic kidney disease: Secondary | ICD-10-CM | POA: Diagnosis not present

## 2020-11-23 DIAGNOSIS — E7800 Pure hypercholesterolemia, unspecified: Secondary | ICD-10-CM | POA: Diagnosis not present

## 2020-11-23 DIAGNOSIS — E1165 Type 2 diabetes mellitus with hyperglycemia: Secondary | ICD-10-CM | POA: Diagnosis not present

## 2020-11-23 DIAGNOSIS — I10 Essential (primary) hypertension: Secondary | ICD-10-CM | POA: Diagnosis not present

## 2020-12-13 DIAGNOSIS — C61 Malignant neoplasm of prostate: Secondary | ICD-10-CM | POA: Diagnosis not present

## 2020-12-13 DIAGNOSIS — N400 Enlarged prostate without lower urinary tract symptoms: Secondary | ICD-10-CM | POA: Diagnosis not present

## 2020-12-13 DIAGNOSIS — Z87442 Personal history of urinary calculi: Secondary | ICD-10-CM | POA: Diagnosis not present

## 2021-03-14 DIAGNOSIS — E1165 Type 2 diabetes mellitus with hyperglycemia: Secondary | ICD-10-CM | POA: Diagnosis not present

## 2021-03-14 DIAGNOSIS — E7800 Pure hypercholesterolemia, unspecified: Secondary | ICD-10-CM | POA: Diagnosis not present

## 2021-03-14 DIAGNOSIS — I10 Essential (primary) hypertension: Secondary | ICD-10-CM | POA: Diagnosis not present

## 2021-03-18 DIAGNOSIS — N1831 Chronic kidney disease, stage 3a: Secondary | ICD-10-CM | POA: Diagnosis not present

## 2021-03-18 DIAGNOSIS — E1165 Type 2 diabetes mellitus with hyperglycemia: Secondary | ICD-10-CM | POA: Diagnosis not present

## 2021-03-18 DIAGNOSIS — E7800 Pure hypercholesterolemia, unspecified: Secondary | ICD-10-CM | POA: Diagnosis not present

## 2021-03-18 DIAGNOSIS — E1140 Type 2 diabetes mellitus with diabetic neuropathy, unspecified: Secondary | ICD-10-CM | POA: Diagnosis not present

## 2021-03-18 DIAGNOSIS — N400 Enlarged prostate without lower urinary tract symptoms: Secondary | ICD-10-CM | POA: Diagnosis not present

## 2021-03-18 DIAGNOSIS — I10 Essential (primary) hypertension: Secondary | ICD-10-CM | POA: Diagnosis not present

## 2021-04-01 DIAGNOSIS — Z23 Encounter for immunization: Secondary | ICD-10-CM | POA: Diagnosis not present

## 2021-05-02 DIAGNOSIS — L729 Follicular cyst of the skin and subcutaneous tissue, unspecified: Secondary | ICD-10-CM | POA: Diagnosis not present

## 2021-05-23 DIAGNOSIS — H25013 Cortical age-related cataract, bilateral: Secondary | ICD-10-CM | POA: Diagnosis not present

## 2021-05-23 DIAGNOSIS — H40013 Open angle with borderline findings, low risk, bilateral: Secondary | ICD-10-CM | POA: Diagnosis not present

## 2021-05-23 DIAGNOSIS — H2513 Age-related nuclear cataract, bilateral: Secondary | ICD-10-CM | POA: Diagnosis not present

## 2021-05-23 DIAGNOSIS — E119 Type 2 diabetes mellitus without complications: Secondary | ICD-10-CM | POA: Diagnosis not present

## 2021-05-26 DIAGNOSIS — B356 Tinea cruris: Secondary | ICD-10-CM | POA: Diagnosis not present

## 2021-06-09 ENCOUNTER — Encounter: Payer: Self-pay | Admitting: Physician Assistant

## 2021-06-09 ENCOUNTER — Ambulatory Visit (INDEPENDENT_AMBULATORY_CARE_PROVIDER_SITE_OTHER): Payer: Medicare Other | Admitting: Physician Assistant

## 2021-06-09 ENCOUNTER — Other Ambulatory Visit: Payer: Self-pay

## 2021-06-09 DIAGNOSIS — D485 Neoplasm of uncertain behavior of skin: Secondary | ICD-10-CM

## 2021-06-09 DIAGNOSIS — L570 Actinic keratosis: Secondary | ICD-10-CM | POA: Diagnosis not present

## 2021-06-09 DIAGNOSIS — Z1283 Encounter for screening for malignant neoplasm of skin: Secondary | ICD-10-CM

## 2021-06-09 DIAGNOSIS — C44729 Squamous cell carcinoma of skin of left lower limb, including hip: Secondary | ICD-10-CM

## 2021-06-09 DIAGNOSIS — L720 Epidermal cyst: Secondary | ICD-10-CM | POA: Diagnosis not present

## 2021-06-09 DIAGNOSIS — Z85828 Personal history of other malignant neoplasm of skin: Secondary | ICD-10-CM

## 2021-06-09 DIAGNOSIS — D044 Carcinoma in situ of skin of scalp and neck: Secondary | ICD-10-CM

## 2021-06-09 NOTE — Patient Instructions (Signed)

## 2021-06-09 NOTE — Progress Notes (Signed)
New Patient   Subjective  Kevin Clements is a 80 y.o. male who presents for the following: Annual Exam (Here for annual skin exam. Concerns cyst on back. History of non mole skin cancers. ).   The following portions of the chart were reviewed this encounter and updated as appropriate:  Tobacco  Allergies  Meds  Problems  Med Hx  Surg Hx  Fam Hx      Objective  Well appearing patient in no apparent distress; mood and affect are within normal limits.  A full examination was performed including scalp, head, eyes, ears, nose, lips, neck, chest, axillae, abdomen, back, buttocks, bilateral upper extremities, bilateral lower extremities, hands, feet, fingers, toes, fingernails, and toenails. All findings within normal limits unless otherwise noted below.  Left Parotid Area Hyperkeratotic scale with pink base      Right Temporal Scalp Hyperkeratotic scale with pink base      Right Parotid Area     left lower back Large nodule just superior to scar from previous surgery  Left Lower Leg - Anterior Volcano growth on pink base       Assessment & Plan  Neoplasm of uncertain behavior of skin (3) Left Parotid Area  Skin / nail biopsy Type of biopsy: tangential   Informed consent: discussed and consent obtained   Timeout: patient name, date of birth, surgical site, and procedure verified   Anesthesia: the lesion was anesthetized in a standard fashion   Anesthetic:  1% lidocaine w/ epinephrine 1-100,000 local infiltration Instrument used: flexible razor blade   Hemostasis achieved with: aluminum chloride and electrodesiccation   Outcome: patient tolerated procedure well   Post-procedure details: wound care instructions given    Specimen 2 - Surgical pathology Differential Diagnosis: bcc scc  Check Margins: yes  Right Temporal Scalp  Skin / nail biopsy Type of biopsy: tangential   Informed consent: discussed and consent obtained   Timeout: patient name, date  of birth, surgical site, and procedure verified   Anesthesia: the lesion was anesthetized in a standard fashion   Anesthetic:  1% lidocaine w/ epinephrine 1-100,000 local infiltration Instrument used: flexible razor blade   Hemostasis achieved with: aluminum chloride and electrodesiccation   Outcome: patient tolerated procedure well   Post-procedure details: wound care instructions given    Specimen 3 - Surgical pathology Differential Diagnosis: bcc scc  Check Margins: yes  Right Parotid Area  Skin / nail biopsy Type of biopsy: tangential   Informed consent: discussed and consent obtained   Timeout: patient name, date of birth, surgical site, and procedure verified   Anesthesia: the lesion was anesthetized in a standard fashion   Anesthetic:  1% lidocaine w/ epinephrine 1-100,000 local infiltration Instrument used: flexible razor blade   Hemostasis achieved with: aluminum chloride and electrodesiccation   Outcome: patient tolerated procedure well   Post-procedure details: wound care instructions given    Specimen 4 - Surgical pathology Differential Diagnosis: bcc scc  Check Margins: yes  Epidermal cyst left lower back  30/45 minute cyst surgery  SCC (squamous cell carcinoma), leg, left Left Lower Leg - Anterior  Skin / nail biopsy Type of biopsy: tangential   Informed consent: discussed and consent obtained   Timeout: patient name, date of birth, surgical site, and procedure verified   Anesthesia: the lesion was anesthetized in a standard fashion   Anesthetic:  1% lidocaine w/ epinephrine 1-100,000 local infiltration Instrument used: flexible razor blade   Hemostasis achieved with: aluminum chloride and electrodesiccation  Outcome: patient tolerated procedure well   Post-procedure details: wound care instructions given    Destruction of lesion Complexity: simple   Destruction method: electrodesiccation and curettage   Informed consent: discussed and consent  obtained   Timeout:  patient name, date of birth, surgical site, and procedure verified Anesthesia: the lesion was anesthetized in a standard fashion   Anesthetic:  1% lidocaine w/ epinephrine 1-100,000 local infiltration Curettage performed in three different directions: Yes   Electrodesiccation performed over the curetted area: Yes   Curettage cycles:  3 Margin per side (cm):  0.1 Final wound size (cm):  1.5 Hemostasis achieved with:  aluminum chloride Outcome: patient tolerated procedure well with no complications   Post-procedure details: wound care instructions given    Specimen 1 - Surgical pathology Differential Diagnosis: ka  tx with bx   Check Margins: No    I, Jenesis Suchy, PA-C, have reviewed all documentation's for this visit.  The documentation on 06/14/21 for the exam, diagnosis, procedures and orders are all accurate and complete.

## 2021-06-14 DIAGNOSIS — Z0000 Encounter for general adult medical examination without abnormal findings: Secondary | ICD-10-CM | POA: Diagnosis not present

## 2021-06-14 DIAGNOSIS — E1165 Type 2 diabetes mellitus with hyperglycemia: Secondary | ICD-10-CM | POA: Diagnosis not present

## 2021-06-14 DIAGNOSIS — N1831 Chronic kidney disease, stage 3a: Secondary | ICD-10-CM | POA: Diagnosis not present

## 2021-06-14 DIAGNOSIS — E7800 Pure hypercholesterolemia, unspecified: Secondary | ICD-10-CM | POA: Diagnosis not present

## 2021-06-14 DIAGNOSIS — D649 Anemia, unspecified: Secondary | ICD-10-CM | POA: Diagnosis not present

## 2021-06-14 DIAGNOSIS — N400 Enlarged prostate without lower urinary tract symptoms: Secondary | ICD-10-CM | POA: Diagnosis not present

## 2021-06-14 DIAGNOSIS — R972 Elevated prostate specific antigen [PSA]: Secondary | ICD-10-CM | POA: Diagnosis not present

## 2021-06-15 ENCOUNTER — Telehealth: Payer: Self-pay | Admitting: *Deleted

## 2021-06-15 DIAGNOSIS — Z23 Encounter for immunization: Secondary | ICD-10-CM | POA: Diagnosis not present

## 2021-06-15 NOTE — Telephone Encounter (Signed)
-----   Message from Warren Danes, [place] sent at 06/14/2021  4:05 PM EDT ----- 3 ---- 30 min surgery

## 2021-06-15 NOTE — Telephone Encounter (Signed)
Pathology to patient- 30 minutes surgery scheduled.

## 2021-06-20 DIAGNOSIS — E1122 Type 2 diabetes mellitus with diabetic chronic kidney disease: Secondary | ICD-10-CM | POA: Diagnosis not present

## 2021-06-20 DIAGNOSIS — Z8546 Personal history of malignant neoplasm of prostate: Secondary | ICD-10-CM | POA: Diagnosis not present

## 2021-06-20 DIAGNOSIS — I10 Essential (primary) hypertension: Secondary | ICD-10-CM | POA: Diagnosis not present

## 2021-06-20 DIAGNOSIS — E1140 Type 2 diabetes mellitus with diabetic neuropathy, unspecified: Secondary | ICD-10-CM | POA: Diagnosis not present

## 2021-06-20 DIAGNOSIS — I2510 Atherosclerotic heart disease of native coronary artery without angina pectoris: Secondary | ICD-10-CM | POA: Diagnosis not present

## 2021-06-20 DIAGNOSIS — Z0000 Encounter for general adult medical examination without abnormal findings: Secondary | ICD-10-CM | POA: Diagnosis not present

## 2021-06-20 DIAGNOSIS — K4021 Bilateral inguinal hernia, without obstruction or gangrene, recurrent: Secondary | ICD-10-CM | POA: Diagnosis not present

## 2021-06-20 DIAGNOSIS — N1832 Chronic kidney disease, stage 3b: Secondary | ICD-10-CM | POA: Diagnosis not present

## 2021-10-06 ENCOUNTER — Other Ambulatory Visit: Payer: Self-pay

## 2021-10-06 ENCOUNTER — Ambulatory Visit (INDEPENDENT_AMBULATORY_CARE_PROVIDER_SITE_OTHER): Payer: Medicare Other | Admitting: Physician Assistant

## 2021-10-06 DIAGNOSIS — D044 Carcinoma in situ of skin of scalp and neck: Secondary | ICD-10-CM | POA: Diagnosis not present

## 2021-10-06 DIAGNOSIS — D049 Carcinoma in situ of skin, unspecified: Secondary | ICD-10-CM

## 2021-10-06 NOTE — Patient Instructions (Signed)

## 2021-10-10 ENCOUNTER — Encounter: Payer: Self-pay | Admitting: Physician Assistant

## 2021-10-10 NOTE — Progress Notes (Signed)
° °  Follow-Up Visit   Subjective  Kevin Clements is a 81 y.o. male who presents for the following: Procedure (Here for treatment of the right temporal scalp-CIS.  His left lower leg was treated after the biopsy and healed nicely.   The following portions of the chart were reviewed this encounter and updated as appropriate:  Tobacco   Allergies   Meds   Problems   Med Hx   Surg Hx   Fam Hx       Objective  Well appearing patient in no apparent distress; mood and affect are within normal limits.  A focused examination was performed including face. Relevant physical exam findings are noted in the Assessment and Plan.  Right Temporal scalp Pink macule.  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  Destruction of lesion Complexity: simple   Destruction method: electrodesiccation and curettage   Informed consent: discussed and consent obtained   Timeout:  patient name, date of birth, surgical site, and procedure verified Anesthesia: the lesion was anesthetized in a standard fashion   Anesthetic:  1% lidocaine w/ epinephrine 1-100,000 local infiltration Curettage performed in three different directions: Yes   Electrodesiccation performed over the curetted area: Yes   Curettage cycles:  3 Final wound size (cm):  1.3 Hemostasis achieved with:  ferric subsulfate Outcome: patient tolerated procedure well with no complications   Post-procedure details: wound care instructions given   Additional details:  Wound innoculated with 5 fluorouracil solution.    I, Kayl Stogdill, PA-C, have reviewed all documentation's for this visit.  The documentation on 10/10/21 for the exam, diagnosis, procedures and orders are all accurate and complete.

## 2021-10-13 ENCOUNTER — Encounter: Payer: Medicare Other | Admitting: Physician Assistant

## 2021-12-12 DIAGNOSIS — Z8546 Personal history of malignant neoplasm of prostate: Secondary | ICD-10-CM | POA: Diagnosis not present

## 2021-12-12 DIAGNOSIS — N1832 Chronic kidney disease, stage 3b: Secondary | ICD-10-CM | POA: Diagnosis not present

## 2021-12-12 DIAGNOSIS — E1122 Type 2 diabetes mellitus with diabetic chronic kidney disease: Secondary | ICD-10-CM | POA: Diagnosis not present

## 2021-12-12 DIAGNOSIS — Z125 Encounter for screening for malignant neoplasm of prostate: Secondary | ICD-10-CM | POA: Diagnosis not present

## 2021-12-12 DIAGNOSIS — I2510 Atherosclerotic heart disease of native coronary artery without angina pectoris: Secondary | ICD-10-CM | POA: Diagnosis not present

## 2021-12-19 DIAGNOSIS — Z8546 Personal history of malignant neoplasm of prostate: Secondary | ICD-10-CM | POA: Diagnosis not present

## 2021-12-19 DIAGNOSIS — K4021 Bilateral inguinal hernia, without obstruction or gangrene, recurrent: Secondary | ICD-10-CM | POA: Diagnosis not present

## 2021-12-19 DIAGNOSIS — N1832 Chronic kidney disease, stage 3b: Secondary | ICD-10-CM | POA: Diagnosis not present

## 2021-12-19 DIAGNOSIS — E1140 Type 2 diabetes mellitus with diabetic neuropathy, unspecified: Secondary | ICD-10-CM | POA: Diagnosis not present

## 2021-12-19 DIAGNOSIS — E1122 Type 2 diabetes mellitus with diabetic chronic kidney disease: Secondary | ICD-10-CM | POA: Diagnosis not present

## 2021-12-19 DIAGNOSIS — I2510 Atherosclerotic heart disease of native coronary artery without angina pectoris: Secondary | ICD-10-CM | POA: Diagnosis not present

## 2021-12-19 DIAGNOSIS — I10 Essential (primary) hypertension: Secondary | ICD-10-CM | POA: Diagnosis not present

## 2022-02-08 ENCOUNTER — Ambulatory Visit: Payer: Medicare Other | Admitting: Physician Assistant

## 2022-04-18 DIAGNOSIS — I2510 Atherosclerotic heart disease of native coronary artery without angina pectoris: Secondary | ICD-10-CM | POA: Diagnosis not present

## 2022-04-18 DIAGNOSIS — Z1331 Encounter for screening for depression: Secondary | ICD-10-CM | POA: Diagnosis not present

## 2022-04-18 DIAGNOSIS — I129 Hypertensive chronic kidney disease with stage 1 through stage 4 chronic kidney disease, or unspecified chronic kidney disease: Secondary | ICD-10-CM | POA: Diagnosis not present

## 2022-04-18 DIAGNOSIS — N1832 Chronic kidney disease, stage 3b: Secondary | ICD-10-CM | POA: Diagnosis not present

## 2022-04-18 DIAGNOSIS — E7800 Pure hypercholesterolemia, unspecified: Secondary | ICD-10-CM | POA: Diagnosis not present

## 2022-04-18 DIAGNOSIS — Z1389 Encounter for screening for other disorder: Secondary | ICD-10-CM | POA: Diagnosis not present

## 2022-04-18 DIAGNOSIS — N401 Enlarged prostate with lower urinary tract symptoms: Secondary | ICD-10-CM | POA: Diagnosis not present

## 2022-04-18 DIAGNOSIS — E1149 Type 2 diabetes mellitus with other diabetic neurological complication: Secondary | ICD-10-CM | POA: Diagnosis not present

## 2022-04-18 DIAGNOSIS — E1122 Type 2 diabetes mellitus with diabetic chronic kidney disease: Secondary | ICD-10-CM | POA: Diagnosis not present

## 2022-04-18 DIAGNOSIS — R972 Elevated prostate specific antigen [PSA]: Secondary | ICD-10-CM | POA: Diagnosis not present

## 2022-06-13 ENCOUNTER — Ambulatory Visit: Payer: Medicare Other | Admitting: Physician Assistant

## 2022-07-07 DIAGNOSIS — Z23 Encounter for immunization: Secondary | ICD-10-CM | POA: Diagnosis not present

## 2022-07-13 DIAGNOSIS — H35371 Puckering of macula, right eye: Secondary | ICD-10-CM | POA: Diagnosis not present

## 2022-07-13 DIAGNOSIS — H2513 Age-related nuclear cataract, bilateral: Secondary | ICD-10-CM | POA: Diagnosis not present

## 2022-07-13 DIAGNOSIS — D3132 Benign neoplasm of left choroid: Secondary | ICD-10-CM | POA: Diagnosis not present

## 2022-07-13 DIAGNOSIS — H43813 Vitreous degeneration, bilateral: Secondary | ICD-10-CM | POA: Diagnosis not present

## 2022-07-13 DIAGNOSIS — E119 Type 2 diabetes mellitus without complications: Secondary | ICD-10-CM | POA: Diagnosis not present

## 2022-07-13 DIAGNOSIS — H35411 Lattice degeneration of retina, right eye: Secondary | ICD-10-CM | POA: Diagnosis not present

## 2022-07-22 DIAGNOSIS — Z23 Encounter for immunization: Secondary | ICD-10-CM | POA: Diagnosis not present

## 2022-09-01 DIAGNOSIS — H25813 Combined forms of age-related cataract, bilateral: Secondary | ICD-10-CM | POA: Diagnosis not present

## 2022-10-23 DIAGNOSIS — E1122 Type 2 diabetes mellitus with diabetic chronic kidney disease: Secondary | ICD-10-CM | POA: Diagnosis not present

## 2022-10-23 DIAGNOSIS — E7800 Pure hypercholesterolemia, unspecified: Secondary | ICD-10-CM | POA: Diagnosis not present

## 2022-10-23 DIAGNOSIS — E1149 Type 2 diabetes mellitus with other diabetic neurological complication: Secondary | ICD-10-CM | POA: Diagnosis not present

## 2022-10-23 DIAGNOSIS — I129 Hypertensive chronic kidney disease with stage 1 through stage 4 chronic kidney disease, or unspecified chronic kidney disease: Secondary | ICD-10-CM | POA: Diagnosis not present

## 2022-10-23 DIAGNOSIS — N401 Enlarged prostate with lower urinary tract symptoms: Secondary | ICD-10-CM | POA: Diagnosis not present

## 2022-10-23 DIAGNOSIS — I2510 Atherosclerotic heart disease of native coronary artery without angina pectoris: Secondary | ICD-10-CM | POA: Diagnosis not present

## 2022-10-23 DIAGNOSIS — N1832 Chronic kidney disease, stage 3b: Secondary | ICD-10-CM | POA: Diagnosis not present

## 2022-10-23 DIAGNOSIS — R972 Elevated prostate specific antigen [PSA]: Secondary | ICD-10-CM | POA: Diagnosis not present

## 2022-11-01 DIAGNOSIS — L570 Actinic keratosis: Secondary | ICD-10-CM | POA: Diagnosis not present

## 2022-11-01 DIAGNOSIS — L814 Other melanin hyperpigmentation: Secondary | ICD-10-CM | POA: Diagnosis not present

## 2022-11-01 DIAGNOSIS — D1801 Hemangioma of skin and subcutaneous tissue: Secondary | ICD-10-CM | POA: Diagnosis not present

## 2022-11-01 DIAGNOSIS — Z85828 Personal history of other malignant neoplasm of skin: Secondary | ICD-10-CM | POA: Diagnosis not present

## 2022-11-01 DIAGNOSIS — L821 Other seborrheic keratosis: Secondary | ICD-10-CM | POA: Diagnosis not present

## 2022-11-01 DIAGNOSIS — L578 Other skin changes due to chronic exposure to nonionizing radiation: Secondary | ICD-10-CM | POA: Diagnosis not present

## 2022-11-01 DIAGNOSIS — D229 Melanocytic nevi, unspecified: Secondary | ICD-10-CM | POA: Diagnosis not present

## 2023-02-14 DIAGNOSIS — H25813 Combined forms of age-related cataract, bilateral: Secondary | ICD-10-CM | POA: Diagnosis not present

## 2023-02-14 DIAGNOSIS — H35411 Lattice degeneration of retina, right eye: Secondary | ICD-10-CM | POA: Diagnosis not present

## 2023-02-14 DIAGNOSIS — H35371 Puckering of macula, right eye: Secondary | ICD-10-CM | POA: Diagnosis not present

## 2023-02-14 DIAGNOSIS — E1136 Type 2 diabetes mellitus with diabetic cataract: Secondary | ICD-10-CM | POA: Diagnosis not present

## 2023-02-14 DIAGNOSIS — Z7984 Long term (current) use of oral hypoglycemic drugs: Secondary | ICD-10-CM | POA: Diagnosis not present

## 2023-02-14 DIAGNOSIS — D3132 Benign neoplasm of left choroid: Secondary | ICD-10-CM | POA: Diagnosis not present

## 2023-04-23 DIAGNOSIS — N401 Enlarged prostate with lower urinary tract symptoms: Secondary | ICD-10-CM | POA: Diagnosis not present

## 2023-04-23 DIAGNOSIS — E1122 Type 2 diabetes mellitus with diabetic chronic kidney disease: Secondary | ICD-10-CM | POA: Diagnosis not present

## 2023-04-23 DIAGNOSIS — E7800 Pure hypercholesterolemia, unspecified: Secondary | ICD-10-CM | POA: Diagnosis not present

## 2023-04-23 DIAGNOSIS — Z1212 Encounter for screening for malignant neoplasm of rectum: Secondary | ICD-10-CM | POA: Diagnosis not present

## 2023-04-23 DIAGNOSIS — R972 Elevated prostate specific antigen [PSA]: Secondary | ICD-10-CM | POA: Diagnosis not present

## 2023-04-23 DIAGNOSIS — N1832 Chronic kidney disease, stage 3b: Secondary | ICD-10-CM | POA: Diagnosis not present

## 2023-04-23 DIAGNOSIS — I2510 Atherosclerotic heart disease of native coronary artery without angina pectoris: Secondary | ICD-10-CM | POA: Diagnosis not present

## 2023-04-23 DIAGNOSIS — I129 Hypertensive chronic kidney disease with stage 1 through stage 4 chronic kidney disease, or unspecified chronic kidney disease: Secondary | ICD-10-CM | POA: Diagnosis not present

## 2023-04-23 DIAGNOSIS — E785 Hyperlipidemia, unspecified: Secondary | ICD-10-CM | POA: Diagnosis not present

## 2023-04-26 DIAGNOSIS — H52202 Unspecified astigmatism, left eye: Secondary | ICD-10-CM | POA: Diagnosis not present

## 2023-04-26 DIAGNOSIS — H25812 Combined forms of age-related cataract, left eye: Secondary | ICD-10-CM | POA: Diagnosis not present

## 2023-04-26 DIAGNOSIS — E1136 Type 2 diabetes mellitus with diabetic cataract: Secondary | ICD-10-CM | POA: Diagnosis not present

## 2023-04-27 DIAGNOSIS — H35371 Puckering of macula, right eye: Secondary | ICD-10-CM | POA: Diagnosis not present

## 2023-04-27 DIAGNOSIS — D3132 Benign neoplasm of left choroid: Secondary | ICD-10-CM | POA: Diagnosis not present

## 2023-04-27 DIAGNOSIS — E119 Type 2 diabetes mellitus without complications: Secondary | ICD-10-CM | POA: Diagnosis not present

## 2023-04-27 DIAGNOSIS — Z9842 Cataract extraction status, left eye: Secondary | ICD-10-CM | POA: Diagnosis not present

## 2023-04-27 DIAGNOSIS — H25811 Combined forms of age-related cataract, right eye: Secondary | ICD-10-CM | POA: Diagnosis not present

## 2023-04-27 DIAGNOSIS — H35411 Lattice degeneration of retina, right eye: Secondary | ICD-10-CM | POA: Diagnosis not present

## 2023-04-30 DIAGNOSIS — L578 Other skin changes due to chronic exposure to nonionizing radiation: Secondary | ICD-10-CM | POA: Diagnosis not present

## 2023-04-30 DIAGNOSIS — L570 Actinic keratosis: Secondary | ICD-10-CM | POA: Diagnosis not present

## 2023-04-30 DIAGNOSIS — L814 Other melanin hyperpigmentation: Secondary | ICD-10-CM | POA: Diagnosis not present

## 2023-04-30 DIAGNOSIS — Z23 Encounter for immunization: Secondary | ICD-10-CM | POA: Diagnosis not present

## 2023-04-30 DIAGNOSIS — L821 Other seborrheic keratosis: Secondary | ICD-10-CM | POA: Diagnosis not present

## 2023-04-30 DIAGNOSIS — E1149 Type 2 diabetes mellitus with other diabetic neurological complication: Secondary | ICD-10-CM | POA: Diagnosis not present

## 2023-04-30 DIAGNOSIS — N401 Enlarged prostate with lower urinary tract symptoms: Secondary | ICD-10-CM | POA: Diagnosis not present

## 2023-04-30 DIAGNOSIS — I2510 Atherosclerotic heart disease of native coronary artery without angina pectoris: Secondary | ICD-10-CM | POA: Diagnosis not present

## 2023-04-30 DIAGNOSIS — Z0000 Encounter for general adult medical examination without abnormal findings: Secondary | ICD-10-CM | POA: Diagnosis not present

## 2023-04-30 DIAGNOSIS — Z1339 Encounter for screening examination for other mental health and behavioral disorders: Secondary | ICD-10-CM | POA: Diagnosis not present

## 2023-04-30 DIAGNOSIS — Z85828 Personal history of other malignant neoplasm of skin: Secondary | ICD-10-CM | POA: Diagnosis not present

## 2023-04-30 DIAGNOSIS — E663 Overweight: Secondary | ICD-10-CM | POA: Diagnosis not present

## 2023-04-30 DIAGNOSIS — Z1331 Encounter for screening for depression: Secondary | ICD-10-CM | POA: Diagnosis not present

## 2023-04-30 DIAGNOSIS — R82998 Other abnormal findings in urine: Secondary | ICD-10-CM | POA: Diagnosis not present

## 2023-04-30 DIAGNOSIS — E7800 Pure hypercholesterolemia, unspecified: Secondary | ICD-10-CM | POA: Diagnosis not present

## 2023-04-30 DIAGNOSIS — I129 Hypertensive chronic kidney disease with stage 1 through stage 4 chronic kidney disease, or unspecified chronic kidney disease: Secondary | ICD-10-CM | POA: Diagnosis not present

## 2023-04-30 DIAGNOSIS — E1122 Type 2 diabetes mellitus with diabetic chronic kidney disease: Secondary | ICD-10-CM | POA: Diagnosis not present

## 2023-04-30 DIAGNOSIS — D229 Melanocytic nevi, unspecified: Secondary | ICD-10-CM | POA: Diagnosis not present

## 2023-04-30 DIAGNOSIS — N1832 Chronic kidney disease, stage 3b: Secondary | ICD-10-CM | POA: Diagnosis not present

## 2023-04-30 DIAGNOSIS — D1801 Hemangioma of skin and subcutaneous tissue: Secondary | ICD-10-CM | POA: Diagnosis not present

## 2023-05-04 DIAGNOSIS — E119 Type 2 diabetes mellitus without complications: Secondary | ICD-10-CM | POA: Diagnosis not present

## 2023-05-04 DIAGNOSIS — Z9842 Cataract extraction status, left eye: Secondary | ICD-10-CM | POA: Diagnosis not present

## 2023-05-04 DIAGNOSIS — H35411 Lattice degeneration of retina, right eye: Secondary | ICD-10-CM | POA: Diagnosis not present

## 2023-05-04 DIAGNOSIS — D3132 Benign neoplasm of left choroid: Secondary | ICD-10-CM | POA: Diagnosis not present

## 2023-05-04 DIAGNOSIS — H25811 Combined forms of age-related cataract, right eye: Secondary | ICD-10-CM | POA: Diagnosis not present

## 2023-05-04 DIAGNOSIS — H35371 Puckering of macula, right eye: Secondary | ICD-10-CM | POA: Diagnosis not present

## 2023-05-10 DIAGNOSIS — H52201 Unspecified astigmatism, right eye: Secondary | ICD-10-CM | POA: Diagnosis not present

## 2023-05-10 DIAGNOSIS — Z7984 Long term (current) use of oral hypoglycemic drugs: Secondary | ICD-10-CM | POA: Diagnosis not present

## 2023-05-10 DIAGNOSIS — H25811 Combined forms of age-related cataract, right eye: Secondary | ICD-10-CM | POA: Diagnosis not present

## 2023-05-10 DIAGNOSIS — Z79899 Other long term (current) drug therapy: Secondary | ICD-10-CM | POA: Diagnosis not present

## 2023-05-10 DIAGNOSIS — E1136 Type 2 diabetes mellitus with diabetic cataract: Secondary | ICD-10-CM | POA: Diagnosis not present

## 2023-05-11 DIAGNOSIS — Z9841 Cataract extraction status, right eye: Secondary | ICD-10-CM | POA: Diagnosis not present

## 2023-05-11 DIAGNOSIS — H35371 Puckering of macula, right eye: Secondary | ICD-10-CM | POA: Diagnosis not present

## 2023-05-11 DIAGNOSIS — D3132 Benign neoplasm of left choroid: Secondary | ICD-10-CM | POA: Diagnosis not present

## 2023-05-11 DIAGNOSIS — Z9842 Cataract extraction status, left eye: Secondary | ICD-10-CM | POA: Diagnosis not present

## 2023-05-11 DIAGNOSIS — E119 Type 2 diabetes mellitus without complications: Secondary | ICD-10-CM | POA: Diagnosis not present

## 2023-05-11 DIAGNOSIS — H35411 Lattice degeneration of retina, right eye: Secondary | ICD-10-CM | POA: Diagnosis not present

## 2023-05-25 DIAGNOSIS — E119 Type 2 diabetes mellitus without complications: Secondary | ICD-10-CM | POA: Diagnosis not present

## 2023-05-25 DIAGNOSIS — D3132 Benign neoplasm of left choroid: Secondary | ICD-10-CM | POA: Diagnosis not present

## 2023-05-25 DIAGNOSIS — H35411 Lattice degeneration of retina, right eye: Secondary | ICD-10-CM | POA: Diagnosis not present

## 2023-05-25 DIAGNOSIS — H35371 Puckering of macula, right eye: Secondary | ICD-10-CM | POA: Diagnosis not present

## 2023-05-25 DIAGNOSIS — Z9842 Cataract extraction status, left eye: Secondary | ICD-10-CM | POA: Diagnosis not present

## 2023-05-25 DIAGNOSIS — Z9841 Cataract extraction status, right eye: Secondary | ICD-10-CM | POA: Diagnosis not present

## 2023-07-04 DIAGNOSIS — D3132 Benign neoplasm of left choroid: Secondary | ICD-10-CM | POA: Diagnosis not present

## 2023-07-04 DIAGNOSIS — E119 Type 2 diabetes mellitus without complications: Secondary | ICD-10-CM | POA: Diagnosis not present

## 2023-07-04 DIAGNOSIS — H35411 Lattice degeneration of retina, right eye: Secondary | ICD-10-CM | POA: Diagnosis not present

## 2023-07-04 DIAGNOSIS — Z9842 Cataract extraction status, left eye: Secondary | ICD-10-CM | POA: Diagnosis not present

## 2023-07-04 DIAGNOSIS — Z9841 Cataract extraction status, right eye: Secondary | ICD-10-CM | POA: Diagnosis not present

## 2023-07-04 DIAGNOSIS — H35371 Puckering of macula, right eye: Secondary | ICD-10-CM | POA: Diagnosis not present

## 2023-07-05 DIAGNOSIS — Z23 Encounter for immunization: Secondary | ICD-10-CM | POA: Diagnosis not present

## 2023-08-09 DIAGNOSIS — E119 Type 2 diabetes mellitus without complications: Secondary | ICD-10-CM | POA: Diagnosis not present

## 2023-08-09 DIAGNOSIS — Z961 Presence of intraocular lens: Secondary | ICD-10-CM | POA: Diagnosis not present

## 2023-08-09 DIAGNOSIS — H43813 Vitreous degeneration, bilateral: Secondary | ICD-10-CM | POA: Diagnosis not present

## 2023-08-09 DIAGNOSIS — H35371 Puckering of macula, right eye: Secondary | ICD-10-CM | POA: Diagnosis not present

## 2023-08-09 DIAGNOSIS — H35411 Lattice degeneration of retina, right eye: Secondary | ICD-10-CM | POA: Diagnosis not present

## 2023-08-09 DIAGNOSIS — D3132 Benign neoplasm of left choroid: Secondary | ICD-10-CM | POA: Diagnosis not present

## 2023-08-23 DIAGNOSIS — H524 Presbyopia: Secondary | ICD-10-CM | POA: Diagnosis not present

## 2023-08-23 DIAGNOSIS — H5203 Hypermetropia, bilateral: Secondary | ICD-10-CM | POA: Diagnosis not present

## 2023-08-23 DIAGNOSIS — H52223 Regular astigmatism, bilateral: Secondary | ICD-10-CM | POA: Diagnosis not present

## 2023-08-24 DIAGNOSIS — M8589 Other specified disorders of bone density and structure, multiple sites: Secondary | ICD-10-CM | POA: Diagnosis not present

## 2023-11-05 DIAGNOSIS — N401 Enlarged prostate with lower urinary tract symptoms: Secondary | ICD-10-CM | POA: Diagnosis not present

## 2023-11-05 DIAGNOSIS — E7800 Pure hypercholesterolemia, unspecified: Secondary | ICD-10-CM | POA: Diagnosis not present

## 2023-11-05 DIAGNOSIS — N1832 Chronic kidney disease, stage 3b: Secondary | ICD-10-CM | POA: Diagnosis not present

## 2023-11-05 DIAGNOSIS — I129 Hypertensive chronic kidney disease with stage 1 through stage 4 chronic kidney disease, or unspecified chronic kidney disease: Secondary | ICD-10-CM | POA: Diagnosis not present

## 2023-11-05 DIAGNOSIS — E663 Overweight: Secondary | ICD-10-CM | POA: Diagnosis not present

## 2023-11-05 DIAGNOSIS — E1122 Type 2 diabetes mellitus with diabetic chronic kidney disease: Secondary | ICD-10-CM | POA: Diagnosis not present

## 2023-11-05 DIAGNOSIS — I2510 Atherosclerotic heart disease of native coronary artery without angina pectoris: Secondary | ICD-10-CM | POA: Diagnosis not present

## 2023-11-05 DIAGNOSIS — E1149 Type 2 diabetes mellitus with other diabetic neurological complication: Secondary | ICD-10-CM | POA: Diagnosis not present

## 2023-12-04 DIAGNOSIS — D1801 Hemangioma of skin and subcutaneous tissue: Secondary | ICD-10-CM | POA: Diagnosis not present

## 2023-12-04 DIAGNOSIS — Z85828 Personal history of other malignant neoplasm of skin: Secondary | ICD-10-CM | POA: Diagnosis not present

## 2023-12-04 DIAGNOSIS — L578 Other skin changes due to chronic exposure to nonionizing radiation: Secondary | ICD-10-CM | POA: Diagnosis not present

## 2023-12-04 DIAGNOSIS — D229 Melanocytic nevi, unspecified: Secondary | ICD-10-CM | POA: Diagnosis not present

## 2023-12-04 DIAGNOSIS — L814 Other melanin hyperpigmentation: Secondary | ICD-10-CM | POA: Diagnosis not present

## 2023-12-04 DIAGNOSIS — L570 Actinic keratosis: Secondary | ICD-10-CM | POA: Diagnosis not present

## 2023-12-04 DIAGNOSIS — L821 Other seborrheic keratosis: Secondary | ICD-10-CM | POA: Diagnosis not present

## 2024-04-18 DIAGNOSIS — E7849 Other hyperlipidemia: Secondary | ICD-10-CM | POA: Diagnosis not present

## 2024-04-25 DIAGNOSIS — E1122 Type 2 diabetes mellitus with diabetic chronic kidney disease: Secondary | ICD-10-CM | POA: Diagnosis not present

## 2024-04-25 DIAGNOSIS — E7800 Pure hypercholesterolemia, unspecified: Secondary | ICD-10-CM | POA: Diagnosis not present

## 2024-04-25 DIAGNOSIS — E1149 Type 2 diabetes mellitus with other diabetic neurological complication: Secondary | ICD-10-CM | POA: Diagnosis not present

## 2024-04-25 DIAGNOSIS — N1832 Chronic kidney disease, stage 3b: Secondary | ICD-10-CM | POA: Diagnosis not present

## 2024-04-25 DIAGNOSIS — R972 Elevated prostate specific antigen [PSA]: Secondary | ICD-10-CM | POA: Diagnosis not present

## 2024-04-25 DIAGNOSIS — N401 Enlarged prostate with lower urinary tract symptoms: Secondary | ICD-10-CM | POA: Diagnosis not present

## 2024-04-25 DIAGNOSIS — I2510 Atherosclerotic heart disease of native coronary artery without angina pectoris: Secondary | ICD-10-CM | POA: Diagnosis not present

## 2024-04-25 DIAGNOSIS — I129 Hypertensive chronic kidney disease with stage 1 through stage 4 chronic kidney disease, or unspecified chronic kidney disease: Secondary | ICD-10-CM | POA: Diagnosis not present

## 2024-05-02 DIAGNOSIS — R82998 Other abnormal findings in urine: Secondary | ICD-10-CM | POA: Diagnosis not present

## 2024-05-02 DIAGNOSIS — E1122 Type 2 diabetes mellitus with diabetic chronic kidney disease: Secondary | ICD-10-CM | POA: Diagnosis not present

## 2024-05-02 DIAGNOSIS — I129 Hypertensive chronic kidney disease with stage 1 through stage 4 chronic kidney disease, or unspecified chronic kidney disease: Secondary | ICD-10-CM | POA: Diagnosis not present

## 2024-05-02 DIAGNOSIS — Z0000 Encounter for general adult medical examination without abnormal findings: Secondary | ICD-10-CM | POA: Diagnosis not present

## 2024-05-02 DIAGNOSIS — Z1339 Encounter for screening examination for other mental health and behavioral disorders: Secondary | ICD-10-CM | POA: Diagnosis not present

## 2024-05-02 DIAGNOSIS — E663 Overweight: Secondary | ICD-10-CM | POA: Diagnosis not present

## 2024-05-02 DIAGNOSIS — Z1331 Encounter for screening for depression: Secondary | ICD-10-CM | POA: Diagnosis not present

## 2024-05-02 DIAGNOSIS — N401 Enlarged prostate with lower urinary tract symptoms: Secondary | ICD-10-CM | POA: Diagnosis not present

## 2024-05-02 DIAGNOSIS — E7800 Pure hypercholesterolemia, unspecified: Secondary | ICD-10-CM | POA: Diagnosis not present

## 2024-05-02 DIAGNOSIS — R2689 Other abnormalities of gait and mobility: Secondary | ICD-10-CM | POA: Diagnosis not present

## 2024-05-02 DIAGNOSIS — N1832 Chronic kidney disease, stage 3b: Secondary | ICD-10-CM | POA: Diagnosis not present

## 2024-05-02 DIAGNOSIS — I2510 Atherosclerotic heart disease of native coronary artery without angina pectoris: Secondary | ICD-10-CM | POA: Diagnosis not present

## 2024-05-02 DIAGNOSIS — E1149 Type 2 diabetes mellitus with other diabetic neurological complication: Secondary | ICD-10-CM | POA: Diagnosis not present

## 2024-05-27 DIAGNOSIS — R2689 Other abnormalities of gait and mobility: Secondary | ICD-10-CM | POA: Diagnosis not present

## 2024-05-27 DIAGNOSIS — R531 Weakness: Secondary | ICD-10-CM | POA: Diagnosis not present

## 2024-05-27 DIAGNOSIS — M25652 Stiffness of left hip, not elsewhere classified: Secondary | ICD-10-CM | POA: Diagnosis not present

## 2024-05-29 DIAGNOSIS — M25652 Stiffness of left hip, not elsewhere classified: Secondary | ICD-10-CM | POA: Diagnosis not present

## 2024-05-29 DIAGNOSIS — R2689 Other abnormalities of gait and mobility: Secondary | ICD-10-CM | POA: Diagnosis not present

## 2024-05-29 DIAGNOSIS — R531 Weakness: Secondary | ICD-10-CM | POA: Diagnosis not present

## 2024-06-03 DIAGNOSIS — R2689 Other abnormalities of gait and mobility: Secondary | ICD-10-CM | POA: Diagnosis not present

## 2024-06-03 DIAGNOSIS — R531 Weakness: Secondary | ICD-10-CM | POA: Diagnosis not present

## 2024-06-03 DIAGNOSIS — M25652 Stiffness of left hip, not elsewhere classified: Secondary | ICD-10-CM | POA: Diagnosis not present

## 2024-06-12 DIAGNOSIS — M25652 Stiffness of left hip, not elsewhere classified: Secondary | ICD-10-CM | POA: Diagnosis not present

## 2024-06-12 DIAGNOSIS — R531 Weakness: Secondary | ICD-10-CM | POA: Diagnosis not present

## 2024-06-12 DIAGNOSIS — R2689 Other abnormalities of gait and mobility: Secondary | ICD-10-CM | POA: Diagnosis not present

## 2024-06-19 DIAGNOSIS — R531 Weakness: Secondary | ICD-10-CM | POA: Diagnosis not present

## 2024-06-19 DIAGNOSIS — R2689 Other abnormalities of gait and mobility: Secondary | ICD-10-CM | POA: Diagnosis not present

## 2024-06-19 DIAGNOSIS — M25652 Stiffness of left hip, not elsewhere classified: Secondary | ICD-10-CM | POA: Diagnosis not present

## 2024-06-26 DIAGNOSIS — R2689 Other abnormalities of gait and mobility: Secondary | ICD-10-CM | POA: Diagnosis not present

## 2024-06-26 DIAGNOSIS — M25652 Stiffness of left hip, not elsewhere classified: Secondary | ICD-10-CM | POA: Diagnosis not present

## 2024-06-26 DIAGNOSIS — R531 Weakness: Secondary | ICD-10-CM | POA: Diagnosis not present

## 2024-07-01 DIAGNOSIS — R2689 Other abnormalities of gait and mobility: Secondary | ICD-10-CM | POA: Diagnosis not present

## 2024-07-01 DIAGNOSIS — R531 Weakness: Secondary | ICD-10-CM | POA: Diagnosis not present

## 2024-07-01 DIAGNOSIS — M25652 Stiffness of left hip, not elsewhere classified: Secondary | ICD-10-CM | POA: Diagnosis not present

## 2024-07-02 DIAGNOSIS — L578 Other skin changes due to chronic exposure to nonionizing radiation: Secondary | ICD-10-CM | POA: Diagnosis not present

## 2024-07-02 DIAGNOSIS — Z85828 Personal history of other malignant neoplasm of skin: Secondary | ICD-10-CM | POA: Diagnosis not present

## 2024-07-02 DIAGNOSIS — L814 Other melanin hyperpigmentation: Secondary | ICD-10-CM | POA: Diagnosis not present

## 2024-07-02 DIAGNOSIS — L821 Other seborrheic keratosis: Secondary | ICD-10-CM | POA: Diagnosis not present

## 2024-07-02 DIAGNOSIS — D1801 Hemangioma of skin and subcutaneous tissue: Secondary | ICD-10-CM | POA: Diagnosis not present

## 2024-07-02 DIAGNOSIS — L570 Actinic keratosis: Secondary | ICD-10-CM | POA: Diagnosis not present

## 2024-07-02 DIAGNOSIS — D229 Melanocytic nevi, unspecified: Secondary | ICD-10-CM | POA: Diagnosis not present

## 2024-07-03 DIAGNOSIS — Z23 Encounter for immunization: Secondary | ICD-10-CM | POA: Diagnosis not present

## 2024-07-03 DIAGNOSIS — R2689 Other abnormalities of gait and mobility: Secondary | ICD-10-CM | POA: Diagnosis not present

## 2024-07-03 DIAGNOSIS — M25652 Stiffness of left hip, not elsewhere classified: Secondary | ICD-10-CM | POA: Diagnosis not present

## 2024-07-03 DIAGNOSIS — R531 Weakness: Secondary | ICD-10-CM | POA: Diagnosis not present

## 2024-07-08 DIAGNOSIS — R2689 Other abnormalities of gait and mobility: Secondary | ICD-10-CM | POA: Diagnosis not present

## 2024-07-08 DIAGNOSIS — M25652 Stiffness of left hip, not elsewhere classified: Secondary | ICD-10-CM | POA: Diagnosis not present

## 2024-07-08 DIAGNOSIS — R531 Weakness: Secondary | ICD-10-CM | POA: Diagnosis not present

## 2024-07-10 DIAGNOSIS — M25652 Stiffness of left hip, not elsewhere classified: Secondary | ICD-10-CM | POA: Diagnosis not present

## 2024-07-10 DIAGNOSIS — R531 Weakness: Secondary | ICD-10-CM | POA: Diagnosis not present

## 2024-07-10 DIAGNOSIS — R2689 Other abnormalities of gait and mobility: Secondary | ICD-10-CM | POA: Diagnosis not present

## 2024-07-24 DIAGNOSIS — R2689 Other abnormalities of gait and mobility: Secondary | ICD-10-CM | POA: Diagnosis not present

## 2024-07-24 DIAGNOSIS — M25652 Stiffness of left hip, not elsewhere classified: Secondary | ICD-10-CM | POA: Diagnosis not present

## 2024-07-24 DIAGNOSIS — R531 Weakness: Secondary | ICD-10-CM | POA: Diagnosis not present

## 2024-07-29 DIAGNOSIS — R2689 Other abnormalities of gait and mobility: Secondary | ICD-10-CM | POA: Diagnosis not present

## 2024-07-29 DIAGNOSIS — M25652 Stiffness of left hip, not elsewhere classified: Secondary | ICD-10-CM | POA: Diagnosis not present

## 2024-07-29 DIAGNOSIS — R531 Weakness: Secondary | ICD-10-CM | POA: Diagnosis not present

## 2024-07-31 DIAGNOSIS — M25652 Stiffness of left hip, not elsewhere classified: Secondary | ICD-10-CM | POA: Diagnosis not present

## 2024-07-31 DIAGNOSIS — R2689 Other abnormalities of gait and mobility: Secondary | ICD-10-CM | POA: Diagnosis not present

## 2024-07-31 DIAGNOSIS — R531 Weakness: Secondary | ICD-10-CM | POA: Diagnosis not present

## 2024-08-05 DIAGNOSIS — R531 Weakness: Secondary | ICD-10-CM | POA: Diagnosis not present

## 2024-08-05 DIAGNOSIS — R2689 Other abnormalities of gait and mobility: Secondary | ICD-10-CM | POA: Diagnosis not present

## 2024-08-05 DIAGNOSIS — M25652 Stiffness of left hip, not elsewhere classified: Secondary | ICD-10-CM | POA: Diagnosis not present

## 2024-08-07 DIAGNOSIS — R2689 Other abnormalities of gait and mobility: Secondary | ICD-10-CM | POA: Diagnosis not present

## 2024-08-07 DIAGNOSIS — R531 Weakness: Secondary | ICD-10-CM | POA: Diagnosis not present

## 2024-08-07 DIAGNOSIS — M25652 Stiffness of left hip, not elsewhere classified: Secondary | ICD-10-CM | POA: Diagnosis not present

## 2024-08-12 DIAGNOSIS — R531 Weakness: Secondary | ICD-10-CM | POA: Diagnosis not present

## 2024-08-12 DIAGNOSIS — M25652 Stiffness of left hip, not elsewhere classified: Secondary | ICD-10-CM | POA: Diagnosis not present

## 2024-08-12 DIAGNOSIS — R2689 Other abnormalities of gait and mobility: Secondary | ICD-10-CM | POA: Diagnosis not present

## 2024-08-14 DIAGNOSIS — R2689 Other abnormalities of gait and mobility: Secondary | ICD-10-CM | POA: Diagnosis not present

## 2024-08-14 DIAGNOSIS — M25652 Stiffness of left hip, not elsewhere classified: Secondary | ICD-10-CM | POA: Diagnosis not present

## 2024-08-14 DIAGNOSIS — R531 Weakness: Secondary | ICD-10-CM | POA: Diagnosis not present

## 2024-08-19 DIAGNOSIS — M25652 Stiffness of left hip, not elsewhere classified: Secondary | ICD-10-CM | POA: Diagnosis not present

## 2024-08-19 DIAGNOSIS — R531 Weakness: Secondary | ICD-10-CM | POA: Diagnosis not present

## 2024-08-19 DIAGNOSIS — R2689 Other abnormalities of gait and mobility: Secondary | ICD-10-CM | POA: Diagnosis not present

## 2024-08-21 DIAGNOSIS — Z961 Presence of intraocular lens: Secondary | ICD-10-CM | POA: Diagnosis not present

## 2024-08-21 DIAGNOSIS — H35411 Lattice degeneration of retina, right eye: Secondary | ICD-10-CM | POA: Diagnosis not present

## 2024-08-21 DIAGNOSIS — R531 Weakness: Secondary | ICD-10-CM | POA: Diagnosis not present

## 2024-08-21 DIAGNOSIS — E119 Type 2 diabetes mellitus without complications: Secondary | ICD-10-CM | POA: Diagnosis not present

## 2024-08-21 DIAGNOSIS — H43813 Vitreous degeneration, bilateral: Secondary | ICD-10-CM | POA: Diagnosis not present

## 2024-08-21 DIAGNOSIS — H35371 Puckering of macula, right eye: Secondary | ICD-10-CM | POA: Diagnosis not present

## 2024-08-21 DIAGNOSIS — R2689 Other abnormalities of gait and mobility: Secondary | ICD-10-CM | POA: Diagnosis not present

## 2024-08-21 DIAGNOSIS — M25652 Stiffness of left hip, not elsewhere classified: Secondary | ICD-10-CM | POA: Diagnosis not present

## 2024-08-21 DIAGNOSIS — D3132 Benign neoplasm of left choroid: Secondary | ICD-10-CM | POA: Diagnosis not present

## 2024-09-04 DIAGNOSIS — R2689 Other abnormalities of gait and mobility: Secondary | ICD-10-CM | POA: Diagnosis not present

## 2024-09-04 DIAGNOSIS — R531 Weakness: Secondary | ICD-10-CM | POA: Diagnosis not present

## 2024-09-04 DIAGNOSIS — M25652 Stiffness of left hip, not elsewhere classified: Secondary | ICD-10-CM | POA: Diagnosis not present

## 2024-09-16 DIAGNOSIS — R531 Weakness: Secondary | ICD-10-CM | POA: Diagnosis not present

## 2024-09-16 DIAGNOSIS — M25652 Stiffness of left hip, not elsewhere classified: Secondary | ICD-10-CM | POA: Diagnosis not present

## 2024-09-16 DIAGNOSIS — R2689 Other abnormalities of gait and mobility: Secondary | ICD-10-CM | POA: Diagnosis not present
